# Patient Record
Sex: Female | Born: 1970 | Race: Black or African American | Hispanic: No | Marital: Single | Smoking: Never smoker
Health system: Southern US, Community
[De-identification: ages and names within clinical notes are randomized; demographics above are authoritative.]

## PROBLEM LIST (undated history)

## (undated) DIAGNOSIS — M5430 Sciatica, unspecified side: Secondary | ICD-10-CM

## (undated) DIAGNOSIS — J45909 Unspecified asthma, uncomplicated: Secondary | ICD-10-CM

## (undated) DIAGNOSIS — O009 Unspecified ectopic pregnancy without intrauterine pregnancy: Secondary | ICD-10-CM

## (undated) DIAGNOSIS — IMO0002 Reserved for concepts with insufficient information to code with codable children: Secondary | ICD-10-CM

## (undated) HISTORY — PX: RIGHT OOPHORECTOMY: SHX2359

## (undated) HISTORY — PX: ABDOMINAL SURGERY: SHX537

---

## 2007-05-30 ENCOUNTER — Emergency Department (HOSPITAL_COMMUNITY): Admission: EM | Admit: 2007-05-30 | Discharge: 2007-05-30 | Payer: Self-pay | Admitting: Emergency Medicine

## 2008-11-08 ENCOUNTER — Emergency Department (HOSPITAL_COMMUNITY): Admission: EM | Admit: 2008-11-08 | Discharge: 2008-11-08 | Payer: Self-pay | Admitting: Internal Medicine

## 2009-04-26 ENCOUNTER — Emergency Department (HOSPITAL_COMMUNITY): Admission: EM | Admit: 2009-04-26 | Discharge: 2009-04-27 | Payer: Self-pay | Admitting: Emergency Medicine

## 2009-11-12 ENCOUNTER — Emergency Department (HOSPITAL_COMMUNITY): Admission: EM | Admit: 2009-11-12 | Discharge: 2009-11-12 | Payer: Self-pay | Admitting: Emergency Medicine

## 2010-03-24 ENCOUNTER — Emergency Department (HOSPITAL_COMMUNITY): Admission: EM | Admit: 2010-03-24 | Discharge: 2010-03-24 | Payer: Self-pay | Admitting: Emergency Medicine

## 2010-09-16 ENCOUNTER — Emergency Department (HOSPITAL_COMMUNITY)
Admission: EM | Admit: 2010-09-16 | Discharge: 2010-09-17 | Disposition: A | Discharge status: Home / Self Care | Payer: Self-pay | Attending: Emergency Medicine | Admitting: Emergency Medicine

## 2010-09-16 DIAGNOSIS — M5380 Other specified dorsopathies, site unspecified: Secondary | ICD-10-CM | POA: Insufficient documentation

## 2010-09-16 DIAGNOSIS — G8929 Other chronic pain: Secondary | ICD-10-CM | POA: Insufficient documentation

## 2010-09-16 DIAGNOSIS — IMO0001 Reserved for inherently not codable concepts without codable children: Secondary | ICD-10-CM | POA: Insufficient documentation

## 2010-09-16 DIAGNOSIS — M546 Pain in thoracic spine: Secondary | ICD-10-CM | POA: Insufficient documentation

## 2010-09-17 ENCOUNTER — Emergency Department (HOSPITAL_COMMUNITY)
Admission: EM | Admit: 2010-09-17 | Discharge: 2010-09-17 | Disposition: A | Discharge status: Home / Self Care | Payer: Self-pay | Attending: Emergency Medicine | Admitting: Emergency Medicine

## 2010-09-17 DIAGNOSIS — M546 Pain in thoracic spine: Secondary | ICD-10-CM | POA: Insufficient documentation

## 2010-09-17 DIAGNOSIS — M5380 Other specified dorsopathies, site unspecified: Secondary | ICD-10-CM | POA: Insufficient documentation

## 2010-09-17 DIAGNOSIS — G8929 Other chronic pain: Secondary | ICD-10-CM | POA: Insufficient documentation

## 2010-09-17 DIAGNOSIS — IMO0001 Reserved for inherently not codable concepts without codable children: Secondary | ICD-10-CM | POA: Insufficient documentation

## 2011-01-27 ENCOUNTER — Ambulatory Visit (INDEPENDENT_AMBULATORY_CARE_PROVIDER_SITE_OTHER): Payer: No Typology Code available for payment source

## 2011-01-27 ENCOUNTER — Emergency Department (HOSPITAL_COMMUNITY): Payer: No Typology Code available for payment source

## 2011-01-27 ENCOUNTER — Emergency Department (HOSPITAL_COMMUNITY)
Admission: EM | Admit: 2011-01-27 | Discharge: 2011-01-27 | Disposition: A | Discharge status: Home / Self Care | Payer: No Typology Code available for payment source | Attending: Emergency Medicine | Admitting: Emergency Medicine

## 2011-01-27 ENCOUNTER — Inpatient Hospital Stay (INDEPENDENT_AMBULATORY_CARE_PROVIDER_SITE_OTHER)
Admission: RE | Admit: 2011-01-27 | Discharge: 2011-01-27 | Disposition: A | Discharge status: Home / Self Care | Payer: No Typology Code available for payment source | Source: Ambulatory Visit | Attending: Family Medicine | Admitting: Family Medicine

## 2011-01-27 DIAGNOSIS — K8020 Calculus of gallbladder without cholecystitis without obstruction: Secondary | ICD-10-CM

## 2011-01-27 DIAGNOSIS — G8929 Other chronic pain: Secondary | ICD-10-CM | POA: Insufficient documentation

## 2011-01-27 DIAGNOSIS — M549 Dorsalgia, unspecified: Secondary | ICD-10-CM | POA: Insufficient documentation

## 2011-01-27 DIAGNOSIS — R110 Nausea: Secondary | ICD-10-CM | POA: Insufficient documentation

## 2011-01-27 DIAGNOSIS — R1011 Right upper quadrant pain: Secondary | ICD-10-CM | POA: Insufficient documentation

## 2011-01-27 LAB — POCT URINALYSIS DIP (DEVICE)
Hgb urine dipstick: NEGATIVE
Ketones, ur: NEGATIVE mg/dL
Specific Gravity, Urine: 1.02 (ref 1.005–1.030)
pH: 6.5 (ref 5.0–8.0)

## 2011-01-27 LAB — COMPREHENSIVE METABOLIC PANEL
Albumin: 3.5 g/dL (ref 3.5–5.2)
Alkaline Phosphatase: 65 U/L (ref 39–117)
BUN: 8 mg/dL (ref 6–23)
Calcium: 9.3 mg/dL (ref 8.4–10.5)
Chloride: 102 mEq/L (ref 96–112)
Creatinine, Ser: 0.86 mg/dL (ref 0.50–1.10)
GFR calc non Af Amer: >60 mL/min (ref >60)
Potassium: 3.9 mEq/L (ref 3.5–5.1)
Total Bilirubin: 0.6 mg/dL (ref 0.3–1.2)

## 2011-01-27 LAB — DIFFERENTIAL
Basophils Absolute: 0 10*3/uL (ref 0.0–0.1)
Eosinophils Absolute: 0.1 10*3/uL (ref 0.0–0.7)
Eosinophils Relative: 1 % (ref 0–5)
Lymphocytes Relative: 30 % (ref 12–46)
Monocytes Absolute: 0.3 10*3/uL (ref 0.1–1.0)
Monocytes Relative: 5 % (ref 3–12)
Neutro Abs: 4.2 10*3/uL (ref 1.7–7.7)
Neutrophils Relative %: 63 % (ref 43–77)

## 2011-01-27 LAB — POCT PREGNANCY, URINE: Preg Test, Ur: NEGATIVE

## 2011-01-27 LAB — CBC: MCHC: 34.5 g/dL (ref 30.0–36.0)

## 2011-02-15 ENCOUNTER — Other Ambulatory Visit: Payer: Self-pay | Admitting: Obstetrics

## 2011-02-15 DIAGNOSIS — Z1231 Encounter for screening mammogram for malignant neoplasm of breast: Secondary | ICD-10-CM

## 2011-02-18 ENCOUNTER — Ambulatory Visit (HOSPITAL_COMMUNITY): Payer: No Typology Code available for payment source | Attending: Obstetrics

## 2011-06-24 ENCOUNTER — Emergency Department (HOSPITAL_COMMUNITY)
Admission: EM | Admit: 2011-06-24 | Discharge: 2011-06-24 | Disposition: A | Discharge status: Home / Self Care | Payer: Self-pay | Attending: Emergency Medicine | Admitting: Emergency Medicine

## 2011-06-24 ENCOUNTER — Encounter: Payer: Self-pay | Admitting: *Deleted

## 2011-06-24 DIAGNOSIS — R05 Cough: Secondary | ICD-10-CM | POA: Insufficient documentation

## 2011-06-24 DIAGNOSIS — R509 Fever, unspecified: Secondary | ICD-10-CM | POA: Insufficient documentation

## 2011-06-24 DIAGNOSIS — R059 Cough, unspecified: Secondary | ICD-10-CM | POA: Insufficient documentation

## 2011-06-24 DIAGNOSIS — R110 Nausea: Secondary | ICD-10-CM | POA: Insufficient documentation

## 2011-06-24 DIAGNOSIS — IMO0001 Reserved for inherently not codable concepts without codable children: Secondary | ICD-10-CM | POA: Insufficient documentation

## 2011-06-24 DIAGNOSIS — J111 Influenza due to unidentified influenza virus with other respiratory manifestations: Secondary | ICD-10-CM | POA: Insufficient documentation

## 2011-06-24 DIAGNOSIS — J3489 Other specified disorders of nose and nasal sinuses: Secondary | ICD-10-CM | POA: Insufficient documentation

## 2011-06-24 MED ORDER — SODIUM CHLORIDE 0.9 % IV BOLUS (SEPSIS)
1000.0000 mL | Freq: Once | INTRAVENOUS | Status: AC
  Administered 2011-06-24: 1000 mL via INTRAVENOUS

## 2011-06-24 MED ORDER — ACETAMINOPHEN 325 MG PO TABS
650.0000 mg | ORAL_TABLET | Freq: Once | ORAL | Status: AC
  Administered 2011-06-24: 17:00:00 via ORAL

## 2011-06-24 MED ORDER — IBUPROFEN 600 MG PO TABS: 600.0000 mg | ORAL_TABLET | Freq: Four times a day (QID) | ORAL | Status: AC | PRN

## 2011-06-24 MED ORDER — HYDROCODONE-ACETAMINOPHEN 5-500 MG PO TABS: 1.0000 | ORAL_TABLET | Freq: Four times a day (QID) | ORAL | Status: AC | PRN

## 2011-06-24 MED ORDER — KETOROLAC TROMETHAMINE 30 MG/ML IJ SOLN
30.0000 mg | Freq: Once | INTRAMUSCULAR | Status: AC
  Administered 2011-06-24: 30 mg via INTRAVENOUS
  Filled 2011-06-24: qty 1

## 2011-06-24 MED ORDER — ACETAMINOPHEN 325 MG PO TABS
ORAL_TABLET | ORAL | Status: AC
  Filled 2011-06-24: qty 2

## 2011-06-24 MED ORDER — PSEUDOEPHEDRINE HCL 60 MG PO TABS: 60.0000 mg | ORAL_TABLET | ORAL | Status: AC | PRN

## 2011-06-24 NOTE — ED Notes (Signed)
rx x 3, pt voiced understanding to f/u with PCP. 

## 2011-06-24 NOTE — ED Notes (Signed)
Pt has been having flu like symptoms with body aches, fever and URI for 2 days.

## 2011-06-24 NOTE — ED Provider Notes (Signed)
History     CSN: [id number] Arrival date & time: 06/24/2011  4:52 PM   First MD Initiated Contact with Patient 06/24/11 2038      Chief Complaint  Patient presents with  . Influenza    (Consider location/radiation/quality/duration/timing/severity/associated sxs/prior treatment) The history is provided by the patient.   Patient is a 40 year old female with no significant medical problems who presents with fever, rhinorrhea, nasal congestion and myalgias.  She states this started 2-3 days ago. It has been constant and progressively worsening. She notes mild intermittent dry cough but no productive sputum and no respiratory distress. She is tolerating by mouth well and has no abdominal pain. She has only had mild nausea which resolved yesterday. She did not get flu shot this year. She has no known sick contacts. Highest fever was 101. Overall severity described as moderate. Nothing has been noted to make things better. No history of pneumonia or serious illness in the past.  History reviewed. No pertinent past medical history.  History reviewed. No pertinent past surgical history.  No family history on file.  History  Substance Use Topics  . Smoking status: Not on file  . Smokeless tobacco: Not on file  . Alcohol Use: No    OB History    Grav Para Term Preterm Abortions TAB SAB Ect Mult Living                  Review of Systems  Constitutional: Negative for fever and chills.  HENT: Positive for congestion and rhinorrhea. Negative for facial swelling.   Eyes: Negative for visual disturbance.  Respiratory: Negative for cough, chest tightness, shortness of breath and wheezing.   Cardiovascular: Negative for chest pain.  Gastrointestinal: Negative for nausea, vomiting, abdominal pain and diarrhea.  Genitourinary: Negative for difficulty urinating.  Skin: Negative for rash.  Neurological: Negative for weakness and numbness.  Psychiatric/Behavioral: Negative for behavioral  problems and confusion.  All other systems reviewed and are negative.    Allergies  Review of patient's allergies indicates no known allergies.  Home Medications   Current Outpatient Rx  Name Route Sig Dispense Refill  . HYDROCODONE-ACETAMINOPHEN 5-500 MG PO TABS Oral Take 1 tablet by mouth every 6 (six) hours as needed for pain. 15 tablet 0  . IBUPROFEN 600 MG PO TABS Oral Take 1 tablet (600 mg total) by mouth every 6 (six) hours as needed for pain. 30 tablet 0  . PSEUDOEPHEDRINE HCL 60 MG PO TABS Oral Take 1 tablet (60 mg total) by mouth every 4 (four) hours as needed for congestion. 30 tablet 0    BP 110/71  Pulse 81  Temp(Src) 98 F (36.7 C) (Oral)  Resp 18  SpO2 100%  Physical Exam  Nursing note and vitals reviewed. Constitutional: She is oriented to person, place, and time. She appears well-developed and well-nourished. No distress.       Appears mildly dehydrated  HENT:  Head: Normocephalic.  Nose: Nose normal.  Eyes: EOM are normal.       Mild clear nasal drainage and congestion  Neck: Normal range of motion. Neck supple.  Cardiovascular: Normal rate, regular rhythm and intact distal pulses.   No murmur heard. Pulmonary/Chest: Effort normal and breath sounds normal. No respiratory distress. She has no wheezes. She has no rales. She exhibits tenderness (mild chest wall ttp).  Abdominal: Soft. She exhibits no distension. There is no tenderness.  Musculoskeletal: Normal range of motion. She exhibits no edema and no tenderness.  No calf TTP  Neurological: She is alert and oriented to person, place, and time.       Normal strength  Skin: Skin is warm and dry. No rash noted. She is not diaphoretic.  Psychiatric: She has a normal mood and affect. Her behavior is normal. Thought content normal.    ED Course  Procedures (including critical care time)  Labs Reviewed - No data to display No results found.   1. Influenza       MDM   Clinical picture is  consistent with influenza. Patient is not pregnant and does not have risk factors that require admission or Tamiflu. She is outside the 48 hour window for normal patients. Patient feels much better after interventions in the emergency department. She was reevaluated and still has good air movement without any respiratory issues. No acute indication for chest x-ray. No clinical evidence of pneumonia or other serious infection. Return precautions discussed.        Milus Glazier 06/25/11 0034

## 2011-06-24 NOTE — ED Notes (Signed)
Body aches; fever; nasal congestion; mild cough. Pt reports 101.1 as highest fever; pt has taken tylenol in triage. Pt reports OTC medicine in attempt to treat symptoms. Mask on pt.

## 2011-06-26 NOTE — ED Provider Notes (Signed)
I saw and evaluated the patient, reviewed the resident's note and I agree with the findings and plan. Female with likely Flu. Improved with treatment  Alisha Torres. Rubin Payor, MD 06/26/11 [phone]

## 2012-08-13 ENCOUNTER — Encounter (HOSPITAL_COMMUNITY): Payer: Self-pay | Admitting: Nurse Practitioner

## 2012-08-13 ENCOUNTER — Emergency Department (HOSPITAL_COMMUNITY)
Admission: EM | Admit: 2012-08-13 | Discharge: 2012-08-13 | Disposition: A | Discharge status: Home / Self Care | Payer: Self-pay | Attending: Emergency Medicine | Admitting: Emergency Medicine

## 2012-08-13 ENCOUNTER — Emergency Department (HOSPITAL_COMMUNITY): Payer: Self-pay

## 2012-08-13 DIAGNOSIS — Y939 Activity, unspecified: Secondary | ICD-10-CM | POA: Insufficient documentation

## 2012-08-13 DIAGNOSIS — S90129A Contusion of unspecified lesser toe(s) without damage to nail, initial encounter: Secondary | ICD-10-CM | POA: Insufficient documentation

## 2012-08-13 DIAGNOSIS — S90112A Contusion of left great toe without damage to nail, initial encounter: Secondary | ICD-10-CM

## 2012-08-13 DIAGNOSIS — IMO0002 Reserved for concepts with insufficient information to code with codable children: Secondary | ICD-10-CM | POA: Insufficient documentation

## 2012-08-13 DIAGNOSIS — Y9289 Other specified places as the place of occurrence of the external cause: Secondary | ICD-10-CM | POA: Insufficient documentation

## 2012-08-13 DIAGNOSIS — S8390XA Sprain of unspecified site of unspecified knee, initial encounter: Secondary | ICD-10-CM

## 2012-08-13 NOTE — ED Notes (Addendum)
Pt states she dropped a generator on her foot 2 nights ago and having pain radiating from foot all the way up to knee since. Also reports bruising to top of foot. Applied ice,tylenol, and elevation with no relief

## 2012-08-13 NOTE — ED Notes (Signed)
Back from xray, talking on phone, NAD, calm.

## 2012-08-13 NOTE — ED Provider Notes (Signed)
History     CSN: [id number]  Arrival date & time 08/13/12  1751   First MD Initiated Contact with Patient 08/13/12 1812      Chief Complaint  Patient presents with  . Foot Injury    (Consider location/radiation/quality/duration/timing/severity/associated sxs/prior treatment) Patient is a 42 y.o. female presenting with foot injury. The history is provided by the patient.  Foot Injury  Pertinent negatives include no numbness.   patient dropped a generator on her foot 2 nights ago. She's had some pain in her left great toe since then. Stepping out of bed last night she felt a pop and pain in her left knee. He said she's had some pain since. The pain is more towards the bottom of her knee. No numbness or weakness. No other trauma. No swelling or legs. No chest pain. No abdominal pain.  History reviewed. No pertinent past medical history.  History reviewed. No pertinent past surgical history.  History reviewed. No pertinent family history.  History  Substance Use Topics  . Smoking status: Never Smoker   . Smokeless tobacco: Not on file  . Alcohol Use: No    OB History    Grav Para Term Preterm Abortions TAB SAB Ect Mult Living                  Review of Systems  Constitutional: Negative for fever and fatigue.  Respiratory: Negative for cough and shortness of breath.   Cardiovascular: Negative for chest pain and leg swelling.  Musculoskeletal: Positive for joint swelling and gait problem. Negative for myalgias and back pain.  Skin: Negative for color change and wound.  Neurological: Negative for numbness.  Hematological: Negative for adenopathy.    Allergies  Review of patient's allergies indicates no known allergies.  Home Medications   Current Outpatient Rx  Name  Route  Sig  Dispense  Refill  . ACETAMINOPHEN 500 MG PO TABS   Oral   Take 1,000 mg by mouth every 6 (six) hours as needed. For pain           BP 131/88  Pulse 68  Temp 98 F (36.7 C) (Oral)   Resp 15  SpO2 100%  Physical Exam  Constitutional: She appears well-developed and well-nourished.  HENT:  Head: Normocephalic.  Cardiovascular: Normal rate.   Pulmonary/Chest: No respiratory distress.  Musculoskeletal:       Subungual bruising on left great toe. Tender to distal phalanx of left great toe. No crepitance or deformity. Skin intact. Range of motion intact it to. No swelling of foot. No swelling of lower leg. Mild tenderness over left anterior knee. Knee effusion. Skin is normal. Strong dorsalis pedis pulse.  Neurological:       Sensation intact over left lower extremity    ED Course  Procedures (including critical care time)  Labs Reviewed - No data to display Dg Knee Complete 4 Views Left  08/13/2012  *RADIOLOGY REPORT*  Clinical Data: Injured left knee.  LEFT KNEE - COMPLETE 4+ VIEW  Comparison: None  Findings: The joint spaces are maintained.  No acute fracture or osteochondral abnormality.  A moderate sized joint effusion is noted.  IMPRESSION: No acute bony findings.  Moderate sized joint effusion.   Original Report Authenticated By: Rudie Meyer, M.D.    Dg Toe Great Left  08/13/2012  *RADIOLOGY REPORT*  Clinical Data: Injured left great toe.  LEFT GREAT TOE  Comparison: None  Findings: The joint spaces are maintained.  No acute fracture.  IMPRESSION:  No acute bony findings.   Original Report Authenticated By: Rudie Meyer, M.D.      1. Knee sprain   2. Contusion of great toe of left foot       MDM  Patient with toe pain and knee pain. X-ray of toe is negative. Knee shows some effusion. Does not appear severe pain for an knee immobilizer. Patient was given an Ace wrap and will followup as needed.        Juliet Rude. Rubin Payor, MD 08/13/12 2358

## 2012-08-13 NOTE — ED Notes (Signed)
CMS intact, pulses palpable, ROM intact, guarding movement, ambulatory with limp, rates pain high, 8/10, feels better after ACE wrap, declined pain med, out to d/c desk in w/c, family coming to give pt ride home.

## 2012-08-13 NOTE — ED Notes (Signed)
Received report from KP, RN, pt out of room to xray via stretcher, alert, NAD, calm, interactive, watching TV, chewing gum.

## 2012-11-08 ENCOUNTER — Emergency Department (HOSPITAL_COMMUNITY)
Admission: EM | Admit: 2012-11-08 | Discharge: 2012-11-08 | Disposition: A | Discharge status: Home / Self Care | Payer: Self-pay | Attending: Emergency Medicine | Admitting: Emergency Medicine

## 2012-11-08 ENCOUNTER — Emergency Department (HOSPITAL_COMMUNITY): Payer: Self-pay

## 2012-11-08 ENCOUNTER — Encounter (HOSPITAL_COMMUNITY): Payer: Self-pay | Admitting: Emergency Medicine

## 2012-11-08 DIAGNOSIS — R209 Unspecified disturbances of skin sensation: Secondary | ICD-10-CM | POA: Insufficient documentation

## 2012-11-08 DIAGNOSIS — M5442 Lumbago with sciatica, left side: Secondary | ICD-10-CM

## 2012-11-08 DIAGNOSIS — Z3202 Encounter for pregnancy test, result negative: Secondary | ICD-10-CM | POA: Insufficient documentation

## 2012-11-08 DIAGNOSIS — M5430 Sciatica, unspecified side: Secondary | ICD-10-CM | POA: Insufficient documentation

## 2012-11-08 LAB — POCT PREGNANCY, URINE: Preg Test, Ur: NEGATIVE

## 2012-11-08 MED ORDER — DIAZEPAM 5 MG/ML IJ SOLN
2.5000 mg | Freq: Once | INTRAMUSCULAR | Status: AC
  Administered 2012-11-08: 2.5 mg via INTRAVENOUS
  Filled 2012-11-08: qty 2

## 2012-11-08 MED ORDER — MORPHINE SULFATE 4 MG/ML IJ SOLN
6.0000 mg | Freq: Once | INTRAMUSCULAR | Status: AC
  Administered 2012-11-08: 6 mg via INTRAVENOUS
  Filled 2012-11-08: qty 2

## 2012-11-08 MED ORDER — METHYLPREDNISOLONE SODIUM SUCC 125 MG IJ SOLR
125.0000 mg | Freq: Once | INTRAMUSCULAR | Status: AC
  Administered 2012-11-08: 125 mg via INTRAVENOUS
  Filled 2012-11-08: qty 2

## 2012-11-08 MED ORDER — IBUPROFEN 800 MG PO TABS
800.0000 mg | ORAL_TABLET | Freq: Once | ORAL | Status: AC
  Administered 2012-11-08: 800 mg via ORAL
  Filled 2012-11-08: qty 1

## 2012-11-08 MED ORDER — PREDNISONE 20 MG PO TABS: 40.0000 mg | ORAL_TABLET | Freq: Every day | ORAL | Status: DC

## 2012-11-08 MED ORDER — CYCLOBENZAPRINE HCL 10 MG PO TABS: 10.0000 mg | ORAL_TABLET | Freq: Two times a day (BID) | ORAL | Status: DC | PRN

## 2012-11-08 MED ORDER — OXYCODONE-ACETAMINOPHEN 5-325 MG PO TABS
1.0000 | ORAL_TABLET | Freq: Once | ORAL | Status: AC
  Administered 2012-11-08: 1 via ORAL
  Filled 2012-11-08: qty 1

## 2012-11-08 MED ORDER — DIPHENHYDRAMINE HCL 50 MG/ML IJ SOLN
INTRAMUSCULAR | Status: AC
  Administered 2012-11-08: 25 mg
  Filled 2012-11-08: qty 1

## 2012-11-08 MED ORDER — OXYCODONE-ACETAMINOPHEN 5-325 MG PO TABS: 1.0000 | ORAL_TABLET | Freq: Four times a day (QID) | ORAL | Status: DC | PRN

## 2012-11-08 NOTE — ED Notes (Signed)
Resting states cannot void now

## 2012-11-08 NOTE — ED Notes (Signed)
States she cannot walk dr powers aware he will be in

## 2012-11-08 NOTE — ED Notes (Signed)
Pt called [facility] has hives on arm where iV is dr powers made aware and pt given 25 mg benadryl

## 2012-11-08 NOTE — ED Notes (Signed)
Pt sleeping pain  Pain is better

## 2012-11-08 NOTE — ED Provider Notes (Signed)
History     CSN: [id number]  Arrival date & time 11/08/12  1053   First MD Initiated Contact with Patient 11/08/12 1055      Chief Complaint  Patient presents with  . Back Pain    (Consider location/radiation/quality/duration/timing/severity/associated sxs/prior treatment) HPI Comments: Alisha Torres reports lower back pain began yesterday as a dull ache and has steadily increased in intensity since.  She can recount no trauma or event that may have precipitated this event.  Patient is a 42 y.o. female presenting with back pain. The history is provided by the patient. No language interpreter was used.  Back Pain Location:  Lumbar spine and gluteal region Quality:  Cramping and shooting Radiates to:  L posterior upper leg and L foot Pain severity:  Severe Pain is:  Same all the time Onset quality:  Gradual Duration:  1 day Timing:  Constant Progression:  Worsening Chronicity:  New (pt had a similar episode several years ago) Context: not falling, not jumping from heights, not lifting heavy objects, not MCA, not MVA, not occupational injury, not pedestrian accident, not physical stress, not recent illness and not recent injury   Relieved by:  Being still Worsened by:  Bending, movement and standing Associated symptoms: numbness (lateral 2 toes on left foot) and paresthesias   Associated symptoms: no abdominal pain, no abdominal swelling, no bladder incontinence, no bowel incontinence, no chest pain, no dysuria, no fever, no headaches, no pelvic pain, no perianal numbness, no tingling and no weakness   Risk factors: no hx of cancer, no lack of exercise, no menopause, not pregnant and no steroid use     History reviewed. No pertinent past medical history.  No past surgical history on file.  No family history on file.  History  Substance Use Topics  . Smoking status: Never Smoker   . Smokeless tobacco: Not on file  . Alcohol Use: No    OB History   Grav Para Term Preterm  Abortions TAB SAB Ect Mult Living                  Review of Systems  Constitutional: Negative for fever.  Cardiovascular: Negative for chest pain.  Gastrointestinal: Negative for abdominal pain and bowel incontinence.  Genitourinary: Negative for bladder incontinence, dysuria and pelvic pain.  Musculoskeletal: Positive for back pain. Negative for myalgias, joint swelling, arthralgias and gait problem.  Skin: Negative for color change, pallor, rash and wound.  Neurological: Positive for numbness (lateral 2 toes on left foot) and paresthesias. Negative for tingling, tremors, syncope, weakness, light-headedness and headaches.  All other systems reviewed and are negative.    Allergies  Review of patient's allergies indicates no known allergies.  Home Medications  No current outpatient prescriptions on file.  BP 126/72  Pulse 71  Resp 16  Physical Exam  Nursing note and vitals reviewed. Constitutional: She is oriented to person, place, and time. She appears well-developed and well-nourished. No distress.  HENT:  Head: Normocephalic and atraumatic.  Right Ear: External ear normal.  Left Ear: External ear normal.  Nose: Nose normal.  Mouth/Throat: Oropharynx is clear and moist. No oropharyngeal exudate.  Eyes: Conjunctivae are normal. Pupils are equal, round, and reactive to light. Right eye exhibits no discharge. Left eye exhibits no discharge. No scleral icterus.  Neck: Normal range of motion. Neck supple. No JVD present. No tracheal deviation present.  Cardiovascular: Normal rate, regular rhythm, normal heart sounds and intact distal pulses.  Exam reveals no gallop and  no friction rub.   No murmur heard. Pulmonary/Chest: Effort normal and breath sounds normal. No stridor. No respiratory distress. She has no wheezes. She has no rales. She exhibits no tenderness.  Abdominal: Soft. Bowel sounds are normal. She exhibits no distension and no mass. There is no tenderness. There is no  rebound and no guarding.  Musculoskeletal: She exhibits tenderness. She exhibits no edema.       Right hip: Normal.       Left hip: Normal.       Right knee: Normal.       Left knee: Normal.       Right ankle: Normal.       Left ankle: Normal.       Cervical back: Normal.       Thoracic back: Normal.       Lumbar back: She exhibits decreased range of motion, tenderness, pain and spasm. She exhibits no bony tenderness, no swelling, no edema, no deformity and no laceration.       Back:       Right upper leg: Normal.       Left upper leg: Normal.       Right lower leg: Normal.       Left lower leg: Normal.       Right foot: Normal.       Left foot: Normal.  Lymphadenopathy:    She has no cervical adenopathy.  Neurological: She is alert and oriented to person, place, and time. She is not disoriented. No cranial nerve deficit. She exhibits normal muscle tone. Coordination normal. She displays no Babinski's sign on the right side. She displays no Babinski's sign on the left side.  Reflex Scores:      Patellar reflexes are 2+ on the right side and 2+ on the left side. + antalgic gait. Pos straight leg raise on left with pain at < 30 degrees.  Intact and symmetric lower extremity strength and sensation to fine touch.  Skin: Skin is warm and dry. No rash noted. She is not diaphoretic. No erythema. No pallor.  Psychiatric: She has a normal mood and affect. Her behavior is normal.    ED Course  Procedures (including critical care time)  Labs Reviewed - No data to display No results found.   No diagnosis found.    MDM  Pt presents for evaluation of acute back pain radiating down the left leg.  She appears acutely uncomfortable, note stable VS, NAD.  Pt unable to complete exam secondary to discomfort. Will administer solumedrol, morphine, and valium.  Will reassess once pain is better controlled.  1400.  Pt stable, NAD.  Pain is greatly improved.  Note no malalignment or fracture on  x-ray.  There is loss of disc height at L4-5.  Plan discharge home.  Encouraged close outpatient follow-up.       Tobin Chad, MD 11/08/12 1407

## 2012-11-08 NOTE — ED Notes (Signed)
Hx of sciatic a few years ago she had shots and it helped pain strated last night tried to get comfortable ended up on floor and then could not get up this am

## 2013-02-14 ENCOUNTER — Ambulatory Visit
Admission: RE | Admit: 2013-02-14 | Discharge: 2013-02-14 | Disposition: A | Discharge status: Home / Self Care | Payer: No Typology Code available for payment source | Source: Ambulatory Visit | Attending: General Practice | Admitting: General Practice

## 2013-02-14 ENCOUNTER — Other Ambulatory Visit: Payer: Self-pay | Admitting: General Practice

## 2013-02-14 DIAGNOSIS — Z9289 Personal history of other medical treatment: Secondary | ICD-10-CM

## 2013-03-03 ENCOUNTER — Emergency Department (HOSPITAL_COMMUNITY): Payer: Medicaid Other

## 2013-03-03 ENCOUNTER — Emergency Department (HOSPITAL_COMMUNITY)
Admission: EM | Admit: 2013-03-03 | Discharge: 2013-03-04 | Disposition: A | Discharge status: Home / Self Care | Payer: Medicaid Other | Attending: Emergency Medicine | Admitting: Emergency Medicine

## 2013-03-03 ENCOUNTER — Encounter (HOSPITAL_COMMUNITY): Payer: Self-pay | Admitting: Emergency Medicine

## 2013-03-03 DIAGNOSIS — R0789 Other chest pain: Secondary | ICD-10-CM | POA: Insufficient documentation

## 2013-03-03 DIAGNOSIS — J45909 Unspecified asthma, uncomplicated: Secondary | ICD-10-CM | POA: Insufficient documentation

## 2013-03-03 DIAGNOSIS — L538 Other specified erythematous conditions: Secondary | ICD-10-CM | POA: Insufficient documentation

## 2013-03-03 DIAGNOSIS — R0989 Other specified symptoms and signs involving the circulatory and respiratory systems: Secondary | ICD-10-CM | POA: Insufficient documentation

## 2013-03-03 DIAGNOSIS — J40 Bronchitis, not specified as acute or chronic: Secondary | ICD-10-CM

## 2013-03-03 LAB — BASIC METABOLIC PANEL
BUN: 8 mg/dL (ref 6–23)
CO2: 25 mEq/L (ref 19–32)
Chloride: 103 mEq/L (ref 96–112)
Glucose, Bld: 84 mg/dL (ref 70–99)
Potassium: 3.8 mEq/L (ref 3.5–5.1)
Sodium: 135 mEq/L (ref 135–145)

## 2013-03-03 LAB — CBC
HCT: 35 % — ABNORMAL LOW (ref 36.0–46.0)
Hemoglobin: 11.8 g/dL — ABNORMAL LOW (ref 12.0–15.0)
MCHC: 33.7 g/dL (ref 30.0–36.0)
RBC: 4.07 MIL/uL (ref 3.87–5.11)

## 2013-03-03 MED ORDER — AZITHROMYCIN 250 MG PO TABS: 250.0000 mg | ORAL_TABLET | Freq: Every day | ORAL | Status: DC

## 2013-03-03 MED ORDER — ALBUTEROL SULFATE HFA 108 (90 BASE) MCG/ACT IN AERS
2.0000 | INHALATION_SPRAY | RESPIRATORY_TRACT | Status: DC | PRN
  Administered 2013-03-03: 2 via RESPIRATORY_TRACT
  Filled 2013-03-03: qty 6.7

## 2013-03-03 MED ORDER — GUAIFENESIN-CODEINE 100-10 MG/5ML PO SOLN: 10.0000 mL | Freq: Three times a day (TID) | ORAL | Status: DC | PRN

## 2013-03-03 MED ORDER — ALBUTEROL SULFATE (5 MG/ML) 0.5% IN NEBU
5.0000 mg | INHALATION_SOLUTION | Freq: Once | RESPIRATORY_TRACT | Status: AC
  Administered 2013-03-03: 5 mg via RESPIRATORY_TRACT
  Filled 2013-03-03: qty 1

## 2013-03-03 MED ORDER — GUAIFENESIN-CODEINE 100-10 MG/5ML PO SOLN
10.0000 mL | Freq: Once | ORAL | Status: AC
  Administered 2013-03-03: 10 mL via ORAL
  Filled 2013-03-03: qty 10

## 2013-03-03 NOTE — ED Notes (Signed)
Pt c/o SOB, with productive cough.

## 2013-03-03 NOTE — ED Provider Notes (Signed)
CSN: [id number]     Arrival date & time 03/03/13  2145 History  This chart was scribed for non-physician practitioner, Marlon Pel, PA-C working with Doug Sou, MD by Greggory Stallion, ED scribe. This patient was seen in room TR09C/TR09C and the patient's care was started at 10:12 PM.   Chief Complaint  Patient presents with  . Cough   The history is provided by the patient. No language interpreter was used.    HPI Comments: Alisha Torres is a 42 y.o. female who presents to the Emergency Department complaining of gradual onset, gradually worsening productive cough of yellow sputum with associated chest tightness that started 4 days ago. She states it feels like something is stuck in her throat. She has tried Benadryl and Mucinex with no relief. She states sitting up sometimes helps the cough and laying flat worsens the cough. 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 She states she has had numbness in her left toes for one month. Pt denies fever, trouble swallowing, CP, palpitations, nausea, emesis, diarrhea, dysuria, headache and visual disturbance as associated symptoms. Pt has been eating and drinking normally. She states she had asthma when she was younger but hasn't had it since she was 39.   No past medical history on file. No past surgical history on file. No family history on file. History  Substance Use Topics  . Smoking status: Never Smoker   . Smokeless tobacco: Not on file  . Alcohol Use: No   OB History   Grav Para Term Preterm Abortions TAB SAB Ect Mult Living                 Review of Systems  Constitutional: Negative for fever and chills.  HENT: Negative for trouble swallowing.   Eyes: Negative for visual disturbance.  Respiratory: Positive for cough and chest tightness.   Cardiovascular: Negative for chest pain and palpitations.  Gastrointestinal: Negative for nausea, vomiting and diarrhea.  Genitourinary:  Negative for dysuria.  Neurological: Negative for headaches.  All other systems reviewed and are negative.    Allergies  Review of patient's allergies indicates no known allergies.  Home Medications   Current Outpatient Rx  Name  Route  Sig  Dispense  Refill  . acetaminophen (TYLENOL) 500 MG tablet   Oral   Take 500-1,000 mg by mouth 2 (two) times daily as needed for pain.         . cetirizine (ZYRTEC) 10 MG tablet   Oral   Take 10 mg by mouth daily as needed for allergies.         Marland Kitchen guaiFENesin (MUCINEX) 600 MG 12 hr tablet   Oral   Take 1,200 mg by mouth 2 (two) times daily as needed (for cough).         Marland Kitchen guaiFENesin-codeine 100-10 MG/5ML syrup   Oral   Take 10 mLs by mouth 3 (three) times daily as needed for cough.   120 mL   0    BP 136/86  Pulse 98  Resp 20  SpO2 97%  LMP 02/05/2013  Physical Exam  Nursing note and vitals reviewed. Constitutional: She is oriented to person, place, and time. She appears well-developed and well-nourished. No distress.  HENT:  Head: Normocephalic and atraumatic.  Right Ear: Tympanic membrane is erythematous.  Left Ear: Tympanic membrane is erythematous.  Red posterior oropharynx. No exudates.   Eyes: EOM are normal. Pupils are equal,  round, and reactive to light.  Neck: Normal range of motion. Neck supple. No tracheal deviation present.  Cardiovascular: Normal rate.   Pulmonary/Chest: Effort normal. No respiratory distress.  Inspiratory rhonchi, left base greater than the right base.   Musculoskeletal: Normal range of motion.  Neurological: She is alert and oriented to person, place, and time.  Skin: Skin is warm and dry. She is not diaphoretic.  Psychiatric: She has a normal mood and affect. Her behavior is normal.    ED Course   Procedures (including critical care time)  DIAGNOSTIC STUDIES: Oxygen Saturation is 97% on RA, normal by my interpretation.    COORDINATION OF CARE:   Labs Reviewed  CBC -  Abnormal; Notable for the following:    Hemoglobin 11.8 (*)    HCT 35.0 (*)    All other components within normal limits  BASIC METABOLIC PANEL   Dg Chest 2 View  03/03/2013   *RADIOLOGY REPORT*  Clinical Data: Cough for 4 days; history of asthma.  CHEST - 2 VIEW  Comparison: Chest radiograph performed 02/14/2013  Findings: The lungs are well-aerated and clear.  There is no evidence of focal opacification, pleural effusion or pneumothorax.  The heart is normal in size; the mediastinal contour is within normal limits.  No acute osseous abnormalities are seen.  IMPRESSION: No acute cardiopulmonary process seen.   Original Report Authenticated By: Tonia Ghent, M.D.   1. Bronchitis     MDM   pts symptoms consistent with URI. Will give Rx for z-pack and guaifenesin with codeine. Given albuterol treatment in ED and HFA inhaler for home.   Normal chest xray, and healthy at baseline.  42 y.o.Anam Creighton's evaluation in the Emergency Department is complete. It has been determined that no acute conditions requiring further emergency intervention are present at this time. The patient/guardian have been advised of the diagnosis and plan. We have discussed signs and symptoms that warrant return to the ED, such as changes or worsening in symptoms.  Vital signs are stable at discharge. Filed Vitals:   03/03/13 2151  BP: 136/86  Pulse: 98  Resp: 20    Patient/guardian has voiced understanding and agreed to follow-up with the PCP or specialist.  I personally performed the services described in this documentation, which was scribed in my presence. The recorded information has been reviewed and is accurate.    Dorthula Matas, PA-C 03/03/13 2354

## 2013-03-03 NOTE — ED Notes (Signed)
Pt st's she has had a cough with shortness of breath x's 4 days.  St's unable to sleep at night due to cough.

## 2013-03-04 NOTE — ED Provider Notes (Signed)
Medical screening examination/treatment/procedure(s) were performed by non-physician practitioner and as supervising physician I was immediately available for consultation/collaboration.  Doug Sou, MD 03/04/13 0272

## 2013-04-03 ENCOUNTER — Encounter (HOSPITAL_COMMUNITY): Payer: Self-pay | Admitting: Emergency Medicine

## 2013-04-03 ENCOUNTER — Emergency Department (HOSPITAL_COMMUNITY)
Admission: EM | Admit: 2013-04-03 | Discharge: 2013-04-03 | Disposition: A | Discharge status: Home / Self Care | Payer: Medicaid Other | Source: Home / Self Care | Attending: Family Medicine | Admitting: Family Medicine

## 2013-04-03 DIAGNOSIS — J302 Other seasonal allergic rhinitis: Secondary | ICD-10-CM

## 2013-04-03 DIAGNOSIS — J309 Allergic rhinitis, unspecified: Secondary | ICD-10-CM

## 2013-04-03 NOTE — ED Provider Notes (Signed)
CSN: [id number]     Arrival date & time 04/03/13  7829 History   First MD Initiated Contact with Patient 04/03/13 [phone]     Chief Complaint  Patient presents with  . Foreign Body in Ear    ?'s cotton from q tip   (Consider location/radiation/quality/duration/timing/severity/associated sxs/prior Treatment) Patient is a 42 y.o. female presenting with foreign body in ear. The history is provided by the patient.  Foreign Body in Ear This is a new problem. The current episode started more than 1 week ago. The problem has not changed since onset.Associated symptoms comments: Sinus cong, pnd, decreased hearing right .Marland Kitchen    History reviewed. No pertinent past medical history. Past Surgical History  Procedure Laterality Date  . Abdominal surgery     History reviewed. No pertinent family history. History  Substance Use Topics  . Smoking status: Never Smoker   . Smokeless tobacco: Not on file  . Alcohol Use: No   OB History   Grav Para Term Preterm Abortions TAB SAB Ect Mult Living                 Review of Systems  Constitutional: Negative.   HENT: Positive for hearing loss, ear pain, congestion, rhinorrhea and postnasal drip.     Allergies  Review of patient's allergies indicates no known allergies.  Home Medications   Current Outpatient Rx  Name  Route  Sig  Dispense  Refill  . acetaminophen (TYLENOL) 500 MG tablet   Oral   Take 500-1,000 mg by mouth 2 (two) times daily as needed for pain.         Marland Kitchen azithromycin (ZITHROMAX) 250 MG tablet   Oral   Take 1 tablet (250 mg total) by mouth daily. Take first 2 tablets together, then 1 every day until finished.   6 tablet   0   . cetirizine (ZYRTEC) 10 MG tablet   Oral   Take 10 mg by mouth daily as needed for allergies.         Marland Kitchen guaiFENesin (MUCINEX) 600 MG 12 hr tablet   Oral   Take 1,200 mg by mouth 2 (two) times daily as needed (for cough).         Marland Kitchen guaiFENesin-codeine 100-10 MG/5ML syrup   Oral   Take 10 mLs  by mouth 3 (three) times daily as needed for cough.   120 mL   0    BP 119/83  Pulse 79  Temp(Src) 99.2 F (37.3 C) (Oral)  Resp 12  SpO2 100%  LMP 03/21/2013 Physical Exam  Nursing note and vitals reviewed. Constitutional: She is oriented to person, place, and time. She appears well-developed and well-nourished.  HENT:  Head: Normocephalic.  Right Ear: External ear normal.  Left Ear: External ear normal.  Mouth/Throat: Oropharynx is clear and moist.  Eyes: Conjunctivae are normal. Pupils are equal, round, and reactive to light.  Neck: Normal range of motion. Neck supple.  Lymphadenopathy:    She has no cervical adenopathy.  Neurological: She is alert and oriented to person, place, and time.  Skin: Skin is warm and dry.    ED Course  Procedures (including critical care time) Labs Review Labs Reviewed - No data to display Imaging Review No results found.  MDM  Ear irrigated, no fb    Linna Hoff, MD 04/03/13 [phone]

## 2013-04-03 NOTE — ED Notes (Signed)
Reports might have q tip cotton stuck in ear. Having pain and muffled sound in ear. States " bad taste in back of mouth"   Pt has flushed ears with no relief in symptoms.

## 2013-05-17 ENCOUNTER — Ambulatory Visit: Payer: Self-pay | Admitting: Obstetrics

## 2013-06-06 ENCOUNTER — Ambulatory Visit (INDEPENDENT_AMBULATORY_CARE_PROVIDER_SITE_OTHER): Payer: Medicaid Other | Admitting: Obstetrics

## 2013-06-06 ENCOUNTER — Encounter: Payer: Self-pay | Admitting: Obstetrics

## 2013-06-06 VITALS — BP 115/76 | HR 83 | Temp 98.3°F | Ht 65.5 in | Wt 211.0 lb

## 2013-06-06 DIAGNOSIS — Z113 Encounter for screening for infections with a predominantly sexual mode of transmission: Secondary | ICD-10-CM

## 2013-06-06 DIAGNOSIS — Z0000 Encounter for general adult medical examination without abnormal findings: Secondary | ICD-10-CM

## 2013-06-06 DIAGNOSIS — Z1239 Encounter for other screening for malignant neoplasm of breast: Secondary | ICD-10-CM

## 2013-06-06 NOTE — Progress Notes (Signed)
.   Subjective:     Alisha Torres is a 42 y.o. female here for a routine exam.  No current complaints.  Personal health questionnaire reviewed: yes.   Gynecologic History Patient's last menstrual period was 05/31/2013. Contraception: none Last Pap: 2 yrs ago. Results were: normal Last mammogram: N/A. Results were: n/a  Obstetric History OB History  No data available     The following portions of the patient's history were reviewed and updated as appropriate: allergies, current medications, past family history, past medical history, past social history, past surgical history and problem list.  Review of Systems Pertinent items are noted in HPI.    Objective:    General appearance: alert and no distress Breasts: normal appearance, no masses or tenderness Abdomen: normal findings: soft, non-tender   Pelvic:  NEFG.  Vagina and cervix normal.  Uterus NSSC, NT  Adnexa negative.  Assessment:    Healthy female exam.    Plan:    Education reviewed: low fat, low cholesterol diet, safe sex/STD prevention and self breast exams. Mammogram ordered. Follow up in: 1 year.

## 2013-06-07 LAB — PAP IG W/ RFLX HPV ASCU

## 2013-06-07 LAB — WET PREP BY MOLECULAR PROBE
Candida species: NEGATIVE
Trichomonas vaginosis: NEGATIVE

## 2013-06-07 LAB — GC/CHLAMYDIA PROBE AMP
CT Probe RNA: NEGATIVE
GC Probe RNA: NEGATIVE

## 2013-06-07 LAB — HEPATITIS B SURFACE ANTIGEN: Hepatitis B Surface Ag: NEGATIVE

## 2013-06-12 ENCOUNTER — Other Ambulatory Visit: Payer: Self-pay | Admitting: *Deleted

## 2013-06-12 DIAGNOSIS — B9689 Other specified bacterial agents as the cause of diseases classified elsewhere: Secondary | ICD-10-CM

## 2013-06-12 MED ORDER — METRONIDAZOLE 500 MG PO TABS: 500.0000 mg | ORAL_TABLET | Freq: Two times a day (BID) | ORAL | Status: DC

## 2013-07-17 ENCOUNTER — Encounter (HOSPITAL_COMMUNITY): Payer: Self-pay | Admitting: Emergency Medicine

## 2013-07-17 ENCOUNTER — Emergency Department (HOSPITAL_COMMUNITY)
Admission: EM | Admit: 2013-07-17 | Discharge: 2013-07-17 | Disposition: A | Discharge status: Home / Self Care | Payer: Medicaid Other | Attending: Emergency Medicine | Admitting: Emergency Medicine

## 2013-07-17 ENCOUNTER — Emergency Department (HOSPITAL_COMMUNITY): Payer: Medicaid Other

## 2013-07-17 DIAGNOSIS — M545 Low back pain, unspecified: Secondary | ICD-10-CM | POA: Insufficient documentation

## 2013-07-17 DIAGNOSIS — M5430 Sciatica, unspecified side: Secondary | ICD-10-CM

## 2013-07-17 DIAGNOSIS — M549 Dorsalgia, unspecified: Secondary | ICD-10-CM

## 2013-07-17 MED ORDER — CYCLOBENZAPRINE HCL 5 MG PO TABS: 5.0000 mg | ORAL_TABLET | Freq: Three times a day (TID) | ORAL | Status: DC | PRN

## 2013-07-17 MED ORDER — OXYCODONE-ACETAMINOPHEN 5-325 MG PO TABS: 1.0000 | ORAL_TABLET | ORAL | Status: DC | PRN

## 2013-07-17 MED ORDER — METHYLPREDNISOLONE 4 MG PO KIT: PACK | ORAL | Status: DC

## 2013-07-17 MED ORDER — IBUPROFEN 600 MG PO TABS: 600.0000 mg | ORAL_TABLET | Freq: Four times a day (QID) | ORAL | Status: DC | PRN

## 2013-07-17 MED ORDER — OXYCODONE-ACETAMINOPHEN 5-325 MG PO TABS
1.0000 | ORAL_TABLET | Freq: Once | ORAL | Status: AC
  Administered 2013-07-17: 1 via ORAL
  Filled 2013-07-17: qty 1

## 2013-07-17 MED ORDER — DIAZEPAM 5 MG PO TABS
10.0000 mg | ORAL_TABLET | Freq: Once | ORAL | Status: AC
  Administered 2013-07-17: 10 mg via ORAL
  Filled 2013-07-17: qty 2

## 2013-07-17 MED ORDER — IBUPROFEN 800 MG PO TABS
800.0000 mg | ORAL_TABLET | Freq: Once | ORAL | Status: AC
  Administered 2013-07-17: 800 mg via ORAL
  Filled 2013-07-17: qty 1

## 2013-07-17 NOTE — ED Provider Notes (Signed)
CSN: [id number]     Arrival date & time 07/17/13  1191 History   First MD Initiated Contact with Patient 07/17/13 [phone]     Chief Complaint  Patient presents with  . Back Pain   (Consider location/radiation/quality/duration/timing/severity/associated sxs/prior Treatment) HPI Pt presents with c/o low back pain.  She states the pain is in her mid lower back and radiates to both sides, radiating down both legs.  Pain is constant and worse with movement.  Pain has been constant over the past 1 week.  She feels some numbness in her little toes on both feet.  No weakness of legs,  No incontinence or retention of bowel or bladder.  No trauma.  No fever.  States she has had back pain in the past but this is lasting longer and is more intense.  There are no other associated systemic symptoms, there are no other alleviating or modifying factors.   History reviewed. No pertinent past medical history. Past Surgical History  Procedure Laterality Date  . Abdominal surgery    . Right oophorectomy     Family History  Problem Relation Age of Onset  . Cancer Mother     liver  . Anesthesia problems Father   . Heart disease Maternal Grandmother   . Seizures Paternal Grandfather    History  Substance Use Topics  . Smoking status: Never Smoker   . Smokeless tobacco: Not on file  . Alcohol Use: No   OB History   Grav Para Term Preterm Abortions TAB SAB Ect Mult Living                 Review of Systems ROS reviewed and all otherwise negative except for mentioned in HPI  Allergies  Review of patient's allergies indicates no known allergies.  Home Medications   Current Outpatient Rx  Name  Route  Sig  Dispense  Refill  . acetaminophen (TYLENOL) 500 MG tablet   Oral   Take 500-1,000 mg by mouth 2 (two) times daily as needed for pain.         . cyclobenzaprine (FLEXERIL) 5 MG tablet   Oral   Take 1 tablet (5 mg total) by mouth 3 (three) times daily as needed for muscle spasms.   20  tablet   0   . ibuprofen (ADVIL,MOTRIN) 600 MG tablet   Oral   Take 1 tablet (600 mg total) by mouth every 6 (six) hours as needed.   30 tablet   0   . methylPREDNISolone (MEDROL DOSEPAK) 4 MG tablet      Take as directed   21 tablet   0   . oxyCODONE-acetaminophen (ROXICET) 5-325 MG per tablet   Oral   Take 1-2 tablets by mouth every 4 (four) hours as needed for severe pain.   20 tablet   0    BP 123/74  Pulse 69  Temp(Src) 98.6 F (37 C) (Oral)  Resp 20  SpO2 100%  LMP 06/28/2013 Vitals reviewed Physical Exam Physical Examination: General appearance - alert, well appearing, and in no distress Mental status - alert, oriented to person, place, and time Eyes - no conjunctival injection, no scleral icterus Chest - clear to auscultation, no wheezes, rales or rhonchi, symmetric air entry Heart - normal rate, regular rhythm, normal S1, S2, no murmurs, rubs, clicks or gallops Abdomen - soft, nontender, nondistended, no masses or organomegaly Back exam - midline tenderness to palpation of lower lumbar spine, no CVA tenderness Neurological - alert, oriented x  3, strength 5/5 in extremities x 4, sensation intacta distally, 2+ DTR reflexes at knees bilaterally Extremities - peripheral pulses normal, no pedal edema, no clubbing or cyanosis Skin - normal coloration and turgor, no rashes  ED Course  Procedures (including critical care time) Labs Review Labs Reviewed - No data to display Imaging Review Dg Lumbar Spine Complete  07/17/2013   CLINICAL DATA:  Low back pain x1 week without history of trauma.  EXAM: LUMBAR SPINE - COMPLETE 4+ VIEW  COMPARISON:  Lumbar spine series dated November 08, 2012.  FINDINGS: The lumbar vertebral bodies are preserved in height. The intervertebral disc space heights are reasonably well maintained though there is mild chronic loss of height at L4-5. There is no evidence of a pars defect nor spondylolisthesis. No significant facet joint degenerative  change is demonstrated. The pedicles and transverse processes appear intact. The observed portions of the sacrum are normal.  IMPRESSION: There is no acute bony abnormality of the lumbar spine. Mild disc space narrowing at L4-5 is present similar to that seen previously.   Electronically Signed   By: David  [place]   On: 07/17/2013 08:47    EKG Interpretation   None       MDM   1. Back pain   2. Sciatica, unspecified laterality    Pt presenting with low back pain, radiation down legs, some numbness to 5th toes bilaterally, strength 5/5 in lower extremities.  Xray obtained and reassuring.  Shows some disc space narrowing at L4-L5 which is not significantly changed from prior .  Given pain control in the ED-will also discharge with course of steroids and neurosurgery followup as this has been a recurring problem for her.  Discharged with strict return precautions.  Pt agreeable with plan.    Ethelda Chick, MD 07/17/13 [phone]

## 2013-07-17 NOTE — ED Notes (Signed)
Pt c/o lower back pain x 1 week now states the pain is radiating down legs and toes are becoming numb. Pt state she has always had problems with her back but states this feels different.

## 2013-08-09 ENCOUNTER — Other Ambulatory Visit: Payer: Self-pay | Admitting: Neurosurgery

## 2013-08-09 ENCOUNTER — Ambulatory Visit
Admission: RE | Admit: 2013-08-09 | Discharge: 2013-08-09 | Disposition: A | Discharge status: Home / Self Care | Payer: Medicaid Other | Source: Ambulatory Visit | Attending: Neurosurgery | Admitting: Neurosurgery

## 2013-08-09 ENCOUNTER — Encounter: Payer: Self-pay | Admitting: Obstetrics

## 2013-08-09 DIAGNOSIS — M545 Low back pain, unspecified: Secondary | ICD-10-CM

## 2013-09-06 ENCOUNTER — Encounter (HOSPITAL_COMMUNITY): Payer: Self-pay | Admitting: Emergency Medicine

## 2013-09-06 DIAGNOSIS — M5126 Other intervertebral disc displacement, lumbar region: Secondary | ICD-10-CM | POA: Insufficient documentation

## 2013-09-06 NOTE — ED Notes (Signed)
Pt. reports progressing / worsening low back pain radiating to right leg for several days , denies injury or fall / ambulatory . Pt. stated history of sciatica and herniated disc .

## 2013-09-07 ENCOUNTER — Emergency Department (HOSPITAL_COMMUNITY)
Admission: EM | Admit: 2013-09-07 | Discharge: 2013-09-07 | Disposition: A | Discharge status: Home / Self Care | Payer: Medicaid Other | Attending: Emergency Medicine | Admitting: Emergency Medicine

## 2013-09-07 DIAGNOSIS — IMO0002 Reserved for concepts with insufficient information to code with codable children: Secondary | ICD-10-CM

## 2013-09-07 DIAGNOSIS — M5416 Radiculopathy, lumbar region: Secondary | ICD-10-CM

## 2013-09-07 HISTORY — DX: Sciatica, unspecified side: M54.30

## 2013-09-07 HISTORY — DX: Reserved for concepts with insufficient information to code with codable children: IMO0002

## 2013-09-07 MED ORDER — HYDROCODONE-ACETAMINOPHEN 5-325 MG PO TABS: 1.0000 | ORAL_TABLET | Freq: Four times a day (QID) | ORAL | Status: DC | PRN

## 2013-09-07 MED ORDER — DIAZEPAM 5 MG PO TABS
5.0000 mg | ORAL_TABLET | Freq: Once | ORAL | Status: AC
  Administered 2013-09-07: 5 mg via ORAL
  Filled 2013-09-07: qty 1

## 2013-09-07 MED ORDER — DIAZEPAM 5 MG PO TABS: 5.0000 mg | ORAL_TABLET | Freq: Three times a day (TID) | ORAL | Status: DC | PRN

## 2013-09-07 MED ORDER — PREDNISONE 20 MG PO TABS: ORAL_TABLET | ORAL | Status: DC

## 2013-09-07 MED ORDER — KETOROLAC TROMETHAMINE 30 MG/ML IJ SOLN
30.0000 mg | Freq: Once | INTRAMUSCULAR | Status: AC
  Administered 2013-09-07: 30 mg via INTRAMUSCULAR
  Filled 2013-09-07: qty 1

## 2013-09-07 MED ORDER — PREDNISONE 20 MG PO TABS
60.0000 mg | ORAL_TABLET | Freq: Once | ORAL | Status: AC
  Administered 2013-09-07: 60 mg via ORAL
  Filled 2013-09-07: qty 3

## 2013-09-07 NOTE — Discharge Instructions (Signed)
Is called Dr. Mikal Planeabell, and make an appointment for further treatment in the meantime, You have been given a prescription for 3 different medications one is a muscle relaxer called gallium.  Please take as directed.  One is prednisone, which is a very potent anti-inflammatory, as well as Vicodin, which is a narcotic pain medication

## 2013-09-07 NOTE — ED Provider Notes (Signed)
CSN: [id number]     Arrival date & time 09/06/13  2340 History   First MD Initiated Contact with Patient 09/07/13 0229     Chief Complaint  Patient presents with  . Back Pain     (Consider location/radiation/quality/duration/timing/severity/associated sxs/prior Treatment) HPI Comments: Patient has been seen by Dr. Fabiola Backer from neurosurgery, who ordered an MRI of her lumbar spine.  On January 15, was sure she has a disc herniation.  She has not called for followup, appointment, she's not been taking any medication.  She states that when she turns in certain positions.  Pain goes down her leg.  If she is lying still she spine. Per the patient.  The plan is for epidural injections.  If these don't work then consideration for surgery.  Will be discussed with Dr. Fabiola Backer Patient denies any fall, incontinence, inability to ambulate  Patient is a 43 y.o. female presenting with back pain.  Back Pain Associated symptoms: no abdominal pain, no fever, no numbness, no pelvic pain and no weakness     Past Medical History  Diagnosis Date  . Sciatica   . Herniated disc    Past Surgical History  Procedure Laterality Date  . Abdominal surgery    . Right oophorectomy     Family History  Problem Relation Age of Onset  . Cancer Mother     liver  . Anesthesia problems Father   . Heart disease Maternal Grandmother   . Seizures Paternal Grandfather    History  Substance Use Topics  . Smoking status: Never Smoker   . Smokeless tobacco: Not on file  . Alcohol Use: No   OB History   Grav Para Term Preterm Abortions TAB SAB Ect Mult Living                 Review of Systems  Constitutional: Negative for fever, activity change and appetite change.  Respiratory: Negative for shortness of breath.   Gastrointestinal: Negative for abdominal pain.  Genitourinary: Negative for frequency, difficulty urinating, vaginal pain and pelvic pain.  Musculoskeletal: Positive for back pain.  Skin: Negative for  rash and wound.  Neurological: Negative for weakness and numbness.  All other systems reviewed and are negative.      Allergies  Review of patient's allergies indicates no known allergies.  Home Medications  No current outpatient prescriptions on file. BP 128/79  Pulse 83  Temp(Src) 97.9 F (36.6 C) (Oral)  Resp 16  Ht 5\' 5"  (1.651 m)  Wt 214 lb 14.4 oz (97.478 kg)  BMI 35.76 kg/m2  SpO2 98%  LMP 08/14/2013 Physical Exam  Nursing note and vitals reviewed. Constitutional: She is oriented to person, place, and time. She appears well-developed and well-nourished.  Eyes: Pupils are equal, round, and reactive to light.  Neck: Normal range of motion.  Cardiovascular: Normal rate.   Pulmonary/Chest: Effort normal.  Abdominal: Soft. She exhibits no distension.  Musculoskeletal: Normal range of motion. She exhibits no edema and no tenderness.       Back:  Neurological: She is alert and oriented to person, place, and time.  Skin: Skin is warm. No rash noted. No erythema.    ED Course  Procedures (including critical care time) Labs Review Labs Reviewed - No data to display Imaging Review No results found.  EKG Interpretation   None       MDM   Final diagnoses:  None     I've encouraged the patient to call Dr. Mikal Plane to set appointment  to discuss treatment modalities since her MRI scan has been done for approximately one month.  Tonight.  He will give her IM Toradol.  By mouth Valium, and by mouth steroid dose in her home with a short course of these medications until she can see Dr. Phillis Haggisabell    Jaselle Pryer K Lakie Mclouth, NP 09/07/13 [phone]

## 2013-09-07 NOTE — ED Notes (Signed)
Pt c/o pain in lower back that radiates across her buttocks and into her legs, Pt has history of sciatica

## 2013-09-07 NOTE — ED Provider Notes (Signed)
Medical screening examination/treatment/procedure(s) were performed by non-physician practitioner and as supervising physician I was immediately available for consultation/collaboration.   Shaunna Rosetti, MD 09/07/13 0706 

## 2013-10-04 ENCOUNTER — Other Ambulatory Visit: Payer: Self-pay | Admitting: *Deleted

## 2013-10-04 DIAGNOSIS — N939 Abnormal uterine and vaginal bleeding, unspecified: Secondary | ICD-10-CM

## 2013-10-04 MED ORDER — MEDROXYPROGESTERONE ACETATE 10 MG PO TABS
10.0000 mg | ORAL_TABLET | Freq: Every day | ORAL | Status: DC
Start: 2013-10-04 — End: 2013-12-20

## 2013-10-05 ENCOUNTER — Encounter: Payer: Self-pay | Admitting: Obstetrics

## 2013-12-20 ENCOUNTER — Encounter (HOSPITAL_COMMUNITY): Payer: Self-pay | Admitting: Emergency Medicine

## 2013-12-20 ENCOUNTER — Emergency Department (HOSPITAL_COMMUNITY)
Admission: EM | Admit: 2013-12-20 | Discharge: 2013-12-20 | Disposition: A | Discharge status: Home / Self Care | Payer: Medicaid Other | Attending: Emergency Medicine | Admitting: Emergency Medicine

## 2013-12-20 DIAGNOSIS — M62838 Other muscle spasm: Secondary | ICD-10-CM | POA: Insufficient documentation

## 2013-12-20 DIAGNOSIS — M549 Dorsalgia, unspecified: Secondary | ICD-10-CM | POA: Insufficient documentation

## 2013-12-20 DIAGNOSIS — Z79899 Other long term (current) drug therapy: Secondary | ICD-10-CM | POA: Insufficient documentation

## 2013-12-20 DIAGNOSIS — IMO0002 Reserved for concepts with insufficient information to code with codable children: Secondary | ICD-10-CM | POA: Insufficient documentation

## 2013-12-20 DIAGNOSIS — M25519 Pain in unspecified shoulder: Secondary | ICD-10-CM | POA: Insufficient documentation

## 2013-12-20 MED ORDER — TRAMADOL HCL 50 MG PO TABS: 50.0000 mg | ORAL_TABLET | Freq: Four times a day (QID) | ORAL | Status: DC | PRN

## 2013-12-20 MED ORDER — NAPROXEN 500 MG PO TABS: 500.0000 mg | ORAL_TABLET | Freq: Two times a day (BID) | ORAL | Status: DC

## 2013-12-20 NOTE — Discharge Instructions (Signed)
Please read and follow all provided instructions.  Your diagnoses today include:  1. Muscle spasms of neck     Tests performed today include:  Vital signs - see below for your results today  Medications prescribed:   Tramadol - narcotic-like pain medication  DO NOT drive or perform any activities that require you to be awake and alert because this medicine can make you drowsy.    Naproxen - anti-inflammatory pain medication  Do not exceed 500mg  naproxen every 12 hours, take with food  You have been prescribed an anti-inflammatory medication or NSAID. Take with food. Take smallest effective dose for the shortest duration needed for your pain. Stop taking if you experience stomach pain or vomiting.   Take any prescribed medications only as directed.  Home care instructions:   Follow any educational materials contained in this packet  Please rest, use ice or heat on your back for the next several days  Do not lift, push, pull anything more than 10 pounds for the next week  Follow-up instructions: Please follow-up with your primary care provider in the next 1 week for further evaluation of your symptoms. If you do not have a primary care doctor -- see below for referral information.   Return instructions:  SEEK IMMEDIATE MEDICAL ATTENTION IF YOU HAVE:  New numbness, tingling, weakness, or problem with the use of your arms or legs  Severe back pain not relieved with medications  Loss control of your bowels or bladder  Increasing pain in any areas of the body (such as chest or abdominal pain)  Shortness of breath, dizziness, or fainting.   Worsening nausea (feeling sick to your stomach), vomiting, fever, or sweats  Any other emergent concerns regarding your health   Additional Information:  Your vital signs today were: BP 138/88   Pulse 88   Temp(Src) 98 F (36.7 C) (Oral)   Resp 16   SpO2 100% If your blood pressure (BP) was elevated above 135/85 this visit,  please have this repeated by your doctor within one month. --------------

## 2013-12-20 NOTE — ED Notes (Signed)
Pt states that she was reaching down to the shelf to get some candy on Monday.  Felt a pain in her right shoulder/neck.  Went to have a massage yesterday to help the pain but is now having burning to rt neck and shoulder.

## 2013-12-20 NOTE — ED Provider Notes (Signed)
CSN: [id number]     Arrival date & time 12/20/13  9326 History   First MD Initiated Contact with Patient 12/20/13 1003     Chief Complaint  Patient presents with  . Neck Pain  . Shoulder Pain     (Consider location/radiation/quality/duration/timing/severity/associated sxs/prior Treatment) HPI Comments: Patient with history of disc herniation in lower back -- presents with complaint of right neck pain that radiates into her right shoulder and into her right back. Pain began acutely after patient bent over and reached with her R arm, 3 days ago. Patient denies numbness or tingling. She denies weakness in her arms or legs. She denies lower extremity symptoms including urinary retention/incontinence, fecal incontinence. Patient has been taking Bayer aspirin, left over prednisone and diazepam for her symptoms however these medications only make her drowsy. She went and had a massage yesterday but this did not really help. Pain is described as tight and burning. Movement makes pain worse. Nothing makes it better.  The history is provided by the patient.    Past Medical History  Diagnosis Date  . Sciatica   . Herniated disc    Past Surgical History  Procedure Laterality Date  . Abdominal surgery    . Right oophorectomy     Family History  Problem Relation Age of Onset  . Cancer Mother     liver  . Anesthesia problems Father   . Heart disease Maternal Grandmother   . Seizures Paternal Grandfather    History  Substance Use Topics  . Smoking status: Never Smoker   . Smokeless tobacco: Not on file  . Alcohol Use: No   OB History   Grav Para Term Preterm Abortions TAB SAB Ect Mult Living                 Review of Systems  Constitutional: Negative for fever and unexpected weight change.  Gastrointestinal: Negative for constipation.       Negative for fecal incontinence.   Genitourinary: Negative for enuresis.       Negative for urinary incontinence or retention.    Musculoskeletal: Positive for back pain, myalgias, neck pain and neck stiffness. Negative for arthralgias.  Neurological: Negative for weakness and numbness.       Denies saddle paresthesias.      Allergies  Review of patient's allergies indicates no known allergies.  Home Medications   Prior to Admission medications   Medication Sig Start Date End Date Taking? Authorizing Provider  diazepam (VALIUM) 5 MG tablet Take 1 tablet (5 mg total) by mouth every 8 (eight) hours as needed for anxiety. 09/07/13   Arman Filter, NP  HYDROcodone-acetaminophen (NORCO/VICODIN) 5-325 MG per tablet Take 1 tablet by mouth every 6 (six) hours as needed. 09/07/13   Arman Filter, NP  medroxyPROGESTERone (PROVERA) 10 MG tablet Take 1 tablet (10 mg total) by mouth daily. 10/04/13   Brock Bad, MD  predniSONE (DELTASONE) 20 MG tablet 3 Tabs PO Days 1-3, then 2 tabs PO Days 4-6, then 1 tab PO Day 7-9, then Half Tab PO Day 10-12 09/07/13   Arman Filter, NP   BP 138/88  Pulse 88  Temp(Src) 98 F (36.7 C) (Oral)  Resp 16  SpO2 100% Physical Exam  Nursing note and vitals reviewed. Constitutional: She appears well-developed and well-nourished.  HENT:  Head: Normocephalic and atraumatic.  Eyes: Conjunctivae are normal.  Neck: Neck supple. Normal carotid pulses present. No spinous process tenderness present. No rigidity. Decreased range of  motion (Especially with rotation ) present. No edema present.  No meningismus.  Cardiovascular:  Pulses:      Radial pulses are 2+ on the right side, and 2+ on the left side.  Pulmonary/Chest: Effort normal.  Abdominal: Soft. There is no tenderness. There is no CVA tenderness.  Musculoskeletal:       Cervical back: She exhibits decreased range of motion and tenderness. She exhibits no bony tenderness.       Thoracic back: She exhibits tenderness. She exhibits normal range of motion and no bony tenderness.       Lumbar back: She exhibits normal range of motion, no  tenderness and no bony tenderness.       Back:  No step-off noted with palpation of spine.   Neurological: She is alert. She has normal strength and normal reflexes. No sensory deficit.  5/5 strength in entire upper and lower extremities bilaterally. No sensation deficits noted.  Skin: Skin is warm and dry. No rash noted.  Psychiatric: She has a normal mood and affect.    ED Course  Procedures (including critical care time) Labs Review Labs Reviewed - No data to display  Imaging Review No results found.   EKG Interpretation None      10:26 AM Patient seen and examined.   Vital signs reviewed and are as follows: Filed Vitals:   12/20/13 0954  BP: 138/88  Pulse: 88  Temp: 98 F (36.7 C)  Resp: 16    No red flag s/s of back pain. Patient was counseled on back pain precautions and told to do activity as tolerated but do not lift, push, or pull heavy objects more than 10 pounds for the next week.  Patient counseled to use ice or heat on back for no longer than 15 minutes every hour.   She will continue diazepam she has at home. I told her I do not think the prednisone will be helpful at this point.   Patient prescribed narcotic pain medicine and counseled on proper use of narcotic pain medications. Counseled not to combine this medication with others containing tylenol.   Urged patient not to drink alcohol, drive, or perform any other activities that requires focus while taking either of these medications.  Patient urged to follow-up with PCP if pain does not improve with treatment and rest or if pain becomes recurrent. Urged to return with worsening severe pain, loss of bowel or bladder control, trouble walking.   The patient verbalizes understanding and agrees with the plan.   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 Patient does not have any gross neurological deficits on exam today. She does not have  any lower extremity symptoms to really suggest a central cord syndrome. No red flag signs and symptoms. No systemic symptoms of illness. I do not feel imaging is necessary at this point. Will continue to treat conservatively and have patient rest the area.     Renne CriglerJoshua Cherilynn Schomburg, PA-C 12/20/13 1051

## 2013-12-23 NOTE — ED Provider Notes (Signed)
Medical screening examination/treatment/procedure(s) were performed by non-physician practitioner and as supervising physician I was immediately available for consultation/collaboration.   EKG Interpretation None        Merrie Roof, MD 12/23/13 2045

## 2013-12-28 ENCOUNTER — Other Ambulatory Visit: Payer: Self-pay | Admitting: *Deleted

## 2013-12-28 ENCOUNTER — Telehealth: Payer: Self-pay | Admitting: *Deleted

## 2013-12-28 DIAGNOSIS — R399 Unspecified symptoms and signs involving the genitourinary system: Secondary | ICD-10-CM

## 2013-12-28 MED ORDER — SULFAMETHOXAZOLE-TMP DS 800-160 MG PO TABS: 1.0000 | ORAL_TABLET | Freq: Two times a day (BID) | ORAL | Status: DC

## 2013-12-28 NOTE — Telephone Encounter (Signed)
Pt called in to office requesting medication for UTI symptoms.  Pt states that she has been having pain, pressure and some bleeding with urination.   Pt made aware that Rx could be sent to pharmacy per nursing protocol.  Bactrim DS sent to pharmacy.

## 2014-01-28 ENCOUNTER — Telehealth: Payer: Self-pay | Admitting: *Deleted

## 2014-01-28 NOTE — Telephone Encounter (Signed)
Please call patient to schedule.

## 2014-01-28 NOTE — Telephone Encounter (Signed)
Needs office consultation.

## 2014-01-28 NOTE — Telephone Encounter (Signed)
Patient is calling to request a couple referrals for some problems that she is having. Patient wants a referral to a ENT due to chronic problems with her sinus and coughing a thick mucus. Patient also wants to see a GI for her stomch- patient states she has nausea and bloating every time she eats. We are listed as primary on her insurance and so she wants us to refer.

## 2014-02-06 NOTE — Telephone Encounter (Signed)
[id number] - 4:37pm - brm - made appointment for [id number] with Dr. Clearance CootsHarper

## 2014-02-08 ENCOUNTER — Encounter (HOSPITAL_COMMUNITY): Payer: Self-pay | Admitting: Emergency Medicine

## 2014-02-08 ENCOUNTER — Emergency Department (HOSPITAL_COMMUNITY)
Admission: EM | Admit: 2014-02-08 | Discharge: 2014-02-08 | Disposition: A | Discharge status: Home / Self Care | Payer: Medicaid Other | Attending: Emergency Medicine | Admitting: Emergency Medicine

## 2014-02-08 DIAGNOSIS — IMO0002 Reserved for concepts with insufficient information to code with codable children: Secondary | ICD-10-CM | POA: Diagnosis not present

## 2014-02-08 DIAGNOSIS — Z792 Long term (current) use of antibiotics: Secondary | ICD-10-CM | POA: Diagnosis not present

## 2014-02-08 DIAGNOSIS — J0100 Acute maxillary sinusitis, unspecified: Secondary | ICD-10-CM | POA: Insufficient documentation

## 2014-02-08 DIAGNOSIS — Z8739 Personal history of other diseases of the musculoskeletal system and connective tissue: Secondary | ICD-10-CM | POA: Diagnosis not present

## 2014-02-08 DIAGNOSIS — R0982 Postnasal drip: Secondary | ICD-10-CM

## 2014-02-08 DIAGNOSIS — R221 Localized swelling, mass and lump, neck: Secondary | ICD-10-CM | POA: Diagnosis present

## 2014-02-08 DIAGNOSIS — R220 Localized swelling, mass and lump, head: Secondary | ICD-10-CM | POA: Diagnosis present

## 2014-02-08 LAB — RAPID STREP SCREEN (MED CTR MEBANE ONLY): Streptococcus, Group A Screen (Direct): NEGATIVE

## 2014-02-08 MED ORDER — LIDOCAINE VISCOUS 2 % MT SOLN: 15.0000 mL | OROMUCOSAL | Status: DC | PRN

## 2014-02-08 MED ORDER — LIDOCAINE VISCOUS 2 % MT SOLN
20.0000 mL | Freq: Once | OROMUCOSAL | Status: AC
  Administered 2014-02-08: 20 mL via OROMUCOSAL
  Filled 2014-02-08: qty 30

## 2014-02-08 MED ORDER — DEXAMETHASONE SODIUM PHOSPHATE 10 MG/ML IJ SOLN
10.0000 mg | Freq: Once | INTRAMUSCULAR | Status: AC
  Administered 2014-02-08: 10 mg via INTRAMUSCULAR
  Filled 2014-02-08: qty 1

## 2014-02-08 MED ORDER — AMOXICILLIN 500 MG PO CAPS: 500.0000 mg | ORAL_CAPSULE | Freq: Three times a day (TID) | ORAL | Status: DC

## 2014-02-08 MED ORDER — FLUTICASONE PROPIONATE 50 MCG/ACT NA SUSP: 2.0000 | Freq: Every day | NASAL | Status: DC

## 2014-02-08 MED ORDER — AMOXICILLIN 500 MG PO CAPS
500.0000 mg | ORAL_CAPSULE | Freq: Once | ORAL | Status: AC
  Administered 2014-02-08: 500 mg via ORAL
  Filled 2014-02-08: qty 1

## 2014-02-08 NOTE — Discharge Instructions (Signed)
Take your antibiotics as directed and to completion. You should never have any leftover antibiotics! Push fluids and stay well hydrated.   Use nasal saline (you can try [employer] Simply Saline) at least 4 times a day, use saline 5-10 minutes before using the fluticasone (flonase)  Do not use Afrin (Oxymetazoline)  Rest, wash hands frequently  and drink plenty of water.  Please follow with your primary care doctor in the next 2 days for a check-up. They must obtain records for further management.   Do not hesitate to return to the Emergency Department for any new, worsening or concerning symptoms.   Sinusitis Sinusitis is redness, soreness, and swelling (inflammation) of the paranasal sinuses. Paranasal sinuses are air pockets within the bones of your face (beneath the eyes, the middle of the forehead, or above the eyes). In healthy paranasal sinuses, mucus is able to drain out, and air is able to circulate through them by way of your nose. However, when your paranasal sinuses are inflamed, mucus and air can become trapped. This can allow bacteria and other germs to grow and cause infection. Sinusitis can develop quickly and last only a short time (acute) or continue over a long period (chronic). Sinusitis that lasts for more than 12 weeks is considered chronic.  CAUSES  Causes of sinusitis include:  Allergies.  Structural abnormalities, such as displacement of the cartilage that separates your nostrils (deviated septum), which can decrease the air flow through your nose and sinuses and affect sinus drainage.  Functional abnormalities, such as when the small hairs (cilia) that line your sinuses and help remove mucus do not work properly or are not present. SYMPTOMS  Symptoms of acute and chronic sinusitis are the same. The primary symptoms are pain and pressure around the affected sinuses. Other symptoms include:  Upper toothache.  Earache.  Headache.  Bad breath.  Decreased  sense of smell and taste.  A cough, which worsens when you are lying flat.  Fatigue.  Fever.  Thick drainage from your nose, which often is green and may contain pus (purulent).  Swelling and warmth over the affected sinuses. DIAGNOSIS  Your caregiver will perform a physical exam. During the exam, your caregiver may:  Look in your nose for signs of abnormal growths in your nostrils (nasal polyps).  Tap over the affected sinus to check for signs of infection.  View the inside of your sinuses (endoscopy) with a special imaging device with a light attached (endoscope), which is inserted into your sinuses. If your caregiver suspects that you have chronic sinusitis, one or more of the following tests may be recommended:  Allergy tests.  Nasal culture--A sample of mucus is taken from your nose and sent to a lab and screened for bacteria.  Nasal cytology--A sample of mucus is taken from your nose and examined by your caregiver to determine if your sinusitis is related to an allergy. TREATMENT  Most cases of acute sinusitis are related to a viral infection and will resolve on their own within 10 days. Sometimes medicines are prescribed to help relieve symptoms (pain medicine, decongestants, nasal steroid sprays, or saline sprays).  However, for sinusitis related to a bacterial infection, your caregiver will prescribe antibiotic medicines. These are medicines that will help kill the bacteria causing the infection.  Rarely, sinusitis is caused by a fungal infection. In theses cases, your caregiver will prescribe antifungal medicine. For some cases of chronic sinusitis, surgery is needed. Generally, these are cases in which sinusitis  recurs more than 3 times per year, despite other treatments. HOME CARE INSTRUCTIONS   Drink plenty of water. Water helps thin the mucus so your sinuses can drain more easily.  Use a humidifier.  Inhale steam 3 to 4 times a day (for example, sit in the bathroom  with the shower running).  Apply a warm, moist washcloth to your face 3 to 4 times a day, or as directed by your caregiver.  Use saline nasal sprays to help moisten and clean your sinuses.  Take over-the-counter or prescription medicines for pain, discomfort, or fever only as directed by your caregiver. SEEK IMMEDIATE MEDICAL CARE IF:  You have increasing pain or severe headaches.  You have nausea, vomiting, or drowsiness.  You have swelling around your face.  You have vision problems.  You have a stiff neck.  You have difficulty breathing. MAKE SURE YOU:   Understand these instructions.  Will watch your condition.  Will get help right away if you are not doing well or get worse. Document Released: 07/12/2005 Document Revised: 10/04/2011 Document Reviewed: 07/27/2011 [facility] Patient Information 2015 [place], [place]. This information is not intended to replace advice given to you by your health care provider. Make sure you discuss any questions you have with your health care provider.

## 2014-02-08 NOTE — ED Notes (Signed)
Pt able to speak but during assessment, pt wanting to not speak but instead is writing down responses on paper for RN and PA at read.

## 2014-02-08 NOTE — ED Provider Notes (Signed)
CSN: [id number]     Arrival date & time 02/08/14  0541 History   None    Chief Complaint  Patient presents with  . Oral Swelling     (Consider location/radiation/quality/duration/timing/severity/associated sxs/prior Treatment) HPI  Alisha Torres is a 43 y.o. female complaining of burning in her throat onset yesterday. Patient is also complaining of right lymph node swelling, right maxillary sinus pressure. She was taking over-the-counter sinus medication and gargling with saltwater gargles she states that exacerbates the discomfort. Patient denies fever, drooling, endorses a dry cough. Patient denies shortness of breath to me. Note that this contradicts the triage note.  Past Medical History  Diagnosis Date  . Sciatica   . Herniated disc    Past Surgical History  Procedure Laterality Date  . Abdominal surgery    . Right oophorectomy     Family History  Problem Relation Age of Onset  . Cancer Mother     liver  . Anesthesia problems Father   . Heart disease Maternal Grandmother   . Seizures Paternal Grandfather    History  Substance Use Topics  . Smoking status: Never Smoker   . Smokeless tobacco: Not on file  . Alcohol Use: No   OB History   Grav Para Term Preterm Abortions TAB SAB Ect Mult Living                 Review of Systems  10 systems reviewed and found to be negative, except as noted in the HPI.   Allergies  Review of patient's allergies indicates no known allergies.  Home Medications   Prior to Admission medications   Medication Sig Start Date End Date Taking? Authorizing Provider  amoxicillin (AMOXIL) 500 MG capsule Take 1 capsule (500 mg total) by mouth 3 (three) times daily. 02/08/14   Delray Reza, PA-C  fluticasone (FLONASE) 50 MCG/ACT nasal spray Place 2 sprays into both nostrils daily. 02/08/14   Garnett Rekowski, PA-C  lidocaine (XYLOCAINE) 2 % solution Use as directed 15 mLs in the mouth or throat every 3 (three) hours as needed. 02/08/14    Kimberlie Csaszar, PA-C   BP 139/93  Pulse 62  Temp(Src) 98.8 F (37.1 C) (Oral)  Resp 16  Ht 5' 5.5" (1.664 m)  Wt 213 lb (96.616 kg)  BMI 34.89 kg/m2  SpO2 99%  LMP 01/25/2014 Physical Exam  Nursing note and vitals reviewed. Constitutional: She is oriented to person, place, and time. She appears well-developed and well-nourished. No distress.  HENT:  Head: Normocephalic.  Mouth/Throat: Oropharynx is clear and moist. No oropharyngeal exudate.  No drooling or stridor. Posterior pharynx mildly injected; no significant tonsillar hypertrophy. No exudate. Soft palate rises symmetrically. No TTP or induration under tongue.   Right maxillary sinus with mild tenderness to palpation  Mild mucosal edema in the nares.  Bilateral tympanic membranes with normal architecture and good light reflex.    Eyes: Conjunctivae and EOM are normal.  Neck:  Shotty, tender anterior cervical lymphadenopathy worse on the right than the left  Cardiovascular: Normal rate, regular rhythm and intact distal pulses.   Pulmonary/Chest: Effort normal and breath sounds normal. No stridor. No respiratory distress. She has no wheezes. She has no rales. She exhibits no tenderness.  Abdominal: Soft. Bowel sounds are normal. She exhibits no distension and no mass. There is no tenderness. There is no rebound and no guarding.  Musculoskeletal: Normal range of motion.  Lymphadenopathy:    She has cervical adenopathy.  Neurological: She is alert and  oriented to person, place, and time.  Psychiatric: She has a normal mood and affect.    ED Course  Procedures (including critical care time) Labs Review Labs Reviewed  RAPID STREP SCREEN  CULTURE, GROUP A STREP    Imaging Review No results found.   EKG Interpretation None      MDM   Final diagnoses:  Acute maxillary sinusitis, recurrence not specified  Post-nasal drip    Filed Vitals:   02/08/14 0545 02/08/14 0547 02/08/14 0630  BP: 150/99 150/99  139/93  Pulse:  66 62  Temp:  98.8 F (37.1 C)   TempSrc:  Oral   Resp:  16   Height:  5' 5.5" (1.664 m)   Weight:  213 lb (96.616 kg)   SpO2:  100% 99%    Medications  dexamethasone (DECADRON) injection 10 mg (10 mg Intramuscular Given 02/08/14 0704)  amoxicillin (AMOXIL) capsule 500 mg (500 mg Oral Given 02/08/14 0711)  lidocaine (XYLOCAINE) 2 % viscous mouth solution 20 mL (20 mLs Mouth/Throat Given 02/08/14 0701)    Alisha Torres is a 43 y.o. female presenting with burning throat and sinus pressure. Patient afebrile. No tonsillar hypertrophy or exudate. Patient him when their secretions. No signs of peritonsillar abscess. Will treat for sinusitis.   Evaluation does not show pathology that would require ongoing emergent intervention or inpatient treatment. Pt is hemodynamically stable and mentating appropriately. Discussed findings and plan with patient/guardian, who agrees with care plan. All questions answered. Return precautions discussed and outpatient follow up given.   Discharge Medication List as of 02/08/2014  7:03 AM    START taking these medications   Details  amoxicillin (AMOXIL) 500 MG capsule Take 1 capsule (500 mg total) by mouth 3 (three) times daily., Starting 02/08/2014, Until Discontinued, Print    fluticasone (FLONASE) 50 MCG/ACT nasal spray Place 2 sprays into both nostrils daily., Starting 02/08/2014, Until Discontinued, Print    lidocaine (XYLOCAINE) 2 % solution Use as directed 15 mLs in the mouth or throat every 3 (three) hours as needed., Starting 02/08/2014, Until Discontinued, [employer], PA-C 02/08/14 [phone]

## 2014-02-08 NOTE — ED Notes (Signed)
Pt from home, [facility], reports she had a sore throat last night, gargles warm salt water then woke up this morning around 0500 and her throat was swollen and reports SOB. Right tonsil appears swollen, airway intact, able to speak softly but reports burning with breathing in and talking. A&Ox4. O2-99% RA.

## 2014-02-08 NOTE — ED Notes (Signed)
PA at bedside.

## 2014-02-08 NOTE — ED Notes (Signed)
Pt A&OX4,  Ambulatory at d/c with steady gait, NAD

## 2014-02-09 NOTE — ED Provider Notes (Signed)
Medical screening examination/treatment/procedure(s) were performed by non-physician practitioner and as supervising physician I was immediately available for consultation/collaboration.   EKG Interpretation None        Zamyra Allensworth H Marleen Moret, MD 02/09/14 1117 

## 2014-02-10 LAB — CULTURE, GROUP A STREP

## 2014-02-20 ENCOUNTER — Institutional Professional Consult (permissible substitution): Payer: Medicaid Other | Admitting: Obstetrics

## 2014-04-05 ENCOUNTER — Ambulatory Visit: Payer: Medicaid Other | Admitting: Obstetrics & Gynecology

## 2014-04-25 ENCOUNTER — Ambulatory Visit (INDEPENDENT_AMBULATORY_CARE_PROVIDER_SITE_OTHER): Payer: Medicaid Other | Admitting: Obstetrics & Gynecology

## 2014-04-25 VITALS — BP 155/98 | HR 73 | Temp 98.3°F | Wt 228.0 lb

## 2014-04-25 DIAGNOSIS — N6452 Nipple discharge: Secondary | ICD-10-CM

## 2014-04-25 DIAGNOSIS — Z3202 Encounter for pregnancy test, result negative: Secondary | ICD-10-CM

## 2014-04-26 ENCOUNTER — Encounter: Payer: Self-pay | Admitting: Obstetrics & Gynecology

## 2014-04-26 ENCOUNTER — Telehealth: Payer: Self-pay

## 2014-04-26 ENCOUNTER — Other Ambulatory Visit: Payer: Self-pay | Admitting: Obstetrics & Gynecology

## 2014-04-26 DIAGNOSIS — N6452 Nipple discharge: Secondary | ICD-10-CM

## 2014-04-26 NOTE — Progress Notes (Signed)
Patient ID: Alisha Torres, female   DOB: 1971-05-29, 43 y.o.   MRN: [id number]  Chief Complaint  Patient presents with  . Breast Problem    HPI [name] is a 43 y.o. female.  She reports a clear discharge from the right breast 1 week ago after manipulation of the breast.  This was self-limited.  No mass, trauma or pain.  HPI  Past Medical History  Diagnosis Date  . Sciatica   . Herniated disc     Past Surgical History  Procedure Laterality Date  . Abdominal surgery    . Right oophorectomy      Family History  Problem Relation Age of Onset  . Cancer Mother     liver  . Anesthesia problems Father   . Heart disease Maternal Grandmother   . Seizures Paternal Grandfather     Social History History  Substance Use Topics  . Smoking status: Never Smoker   . Smokeless tobacco: Not on file  . Alcohol Use: No    No Known Allergies  Current Outpatient Prescriptions  Medication Sig Dispense Refill  . amoxicillin (AMOXIL) 500 MG capsule Take 1 capsule (500 mg total) by mouth 3 (three) times daily.  21 capsule  0  . fluticasone (FLONASE) 50 MCG/ACT nasal spray Place 2 sprays into both nostrils daily.  16 g  0  . lidocaine (XYLOCAINE) 2 % solution Use as directed 15 mLs in the mouth or throat every 3 (three) hours as needed.  100 mL  0   No current facility-administered medications for this visit.    Review of Systems Review of Systems Constitutional: negative for fatigue and weight loss Respiratory: negative for cough and wheezing Cardiovascular: negative for chest pain, fatigue and palpitations Gastrointestinal: positive for nausea, abdominal pain Genitourinary:negative for abnormal vaginal discharge Integument/breast: positive for nipple discharge Musculoskeletal:negative for myalgias Neurological: negative for gait problems and tremors Behavioral/Psych: negative for abusive relationship, depression Endocrine: negative for temperature intolerance     Blood  pressure 155/98, pulse 73, temperature 98.3 F (36.8 C), weight 103.42 kg (228 lb), last menstrual period 03/21/2014.  Physical Exam Physical Exam General:   alert  Skin:   no rash or abnormalities  Lungs:   clear to auscultation bilaterally  Heart:   regular rate and rhythm, S1, S2 normal, no murmur, click, rub or gallop  Breasts:   normal without suspicious masses, skin or nipple changes or axillary nodes      Data Reviewed None Assessment    Breast discharge--not spontaneous Normal exam    Plan   Pt reassured Education materials provided re: breast changes Orders Placed This Encounter  Procedures  . MM Digital Screening    Standing Status: Future     Number of Occurrences:      Standing Expiration Date: 06/26/2015    Order Specific Question:  Reason for Exam (SYMPTOM  OR DIAGNOSIS REQUIRED)    Answer:  Routine Screening    Order Specific Question:  Is the patient pregnant?    Answer:  No    Order Specific Question:  Preferred imaging location?    Answer:  [facility]  . Ambulatory referral to ENT    Referral Priority:  Routine    Referral Type:  Consultation    Referral Reason:  Specialty Services Required    Requested Specialty:  Otolaryngology    Number of Visits Requested:  1  . Ambulatory referral to Gastroenterology    Referral Priority:  Routine    Referral Type:  Consultation    Referral Reason:  Specialty Services Required    Requested Specialty:  Gastroenterology    Number of Visits Requested:  1     Follow up as needed.      JACKSON-MOORE,Dorethea Strubel A 04/26/2014, 1:17 PM

## 2014-04-26 NOTE — Patient Instructions (Signed)
Mammography Mammography is an X-ray of the breasts to look for changes that are not normal. The X-ray image is called a mammogram. This procedure can screen for breast cancer, can detect cancer early, and can diagnose cancer.  LET YOUR CAREGIVER KNOW ABOUT:  Breast implants.  Previous breast disease, biopsy, or surgery.  If you are breastfeeding.  Medicines taken, including vitamins, herbs, eyedrops, over-the-counter medicines, and creams.  Use of steroids (by mouth or creams).  Possibility of pregnancy, if this applies. RISKS AND COMPLICATIONS  Exposure to radiation, but at very low levels.  The results may be misinterpreted.  The results may not be accurate.  Mammography may lead to further tests.  Mammography may not catch certain cancers. BEFORE THE PROCEDURE  Schedule your test about 7 days after your menstrual period. This is when your breasts are the least tender and have signs of hormone changes.  If you have had a mammography done at a different facility in the past, get the mammogram X-rays or have them sent to your current exam facility in order to compare them.  Wash your breasts and under your arms the day of the test.  Do not wear deodorants, perfumes, or powders anywhere on your body.  Wear clothes that you can change in and out of easily. PROCEDURE Relax as much as possible during the test. Any discomfort during the test will be very brief. The test should take less than 30 minutes. The following will happen:  You will undress from the waist up and put on a gown.  You will stand in front of the X-ray machine.  Each breast will be placed between 2 plastic or glass plates. The plates will compress your breast for a few seconds.  X-rays will be taken from different angles of the breast. AFTER THE PROCEDURE  The mammogram will be examined.  Depending on the quality of the images, you may need to repeat certain parts of the test.  Ask when your test  results will be ready. Make sure you get your test results.  You may resume normal activities. Document Released: 07/09/2000 Document Revised: 10/04/2011 Document Reviewed: 05/02/2011 [employer] Patient Information 2015 [employer]. This information is not intended to replace advice given to you by your health care provider. Make sure you discuss any questions you have with your health care provider.  

## 2014-04-26 NOTE — Telephone Encounter (Signed)
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

## 2014-04-29 ENCOUNTER — Other Ambulatory Visit: Payer: Self-pay | Admitting: Obstetrics & Gynecology

## 2014-04-29 ENCOUNTER — Other Ambulatory Visit: Payer: Self-pay

## 2014-04-29 DIAGNOSIS — N6452 Nipple discharge: Secondary | ICD-10-CM

## 2014-05-01 ENCOUNTER — Other Ambulatory Visit: Payer: Self-pay | Admitting: *Deleted

## 2014-05-03 ENCOUNTER — Other Ambulatory Visit: Payer: Medicaid Other

## 2014-06-05 ENCOUNTER — Ambulatory Visit
Admission: RE | Admit: 2014-06-05 | Discharge: 2014-06-05 | Disposition: A | Discharge status: Home / Self Care | Payer: Medicaid Other | Source: Ambulatory Visit | Attending: Obstetrics & Gynecology | Admitting: Obstetrics & Gynecology

## 2014-06-05 DIAGNOSIS — N6452 Nipple discharge: Secondary | ICD-10-CM

## 2014-07-22 ENCOUNTER — Encounter: Payer: Self-pay | Admitting: *Deleted

## 2014-07-23 ENCOUNTER — Encounter: Payer: Self-pay | Admitting: Obstetrics & Gynecology

## 2014-08-08 ENCOUNTER — Emergency Department (HOSPITAL_COMMUNITY): Payer: Medicaid Other

## 2014-08-08 ENCOUNTER — Encounter (HOSPITAL_COMMUNITY): Payer: Self-pay | Admitting: *Deleted

## 2014-08-08 ENCOUNTER — Emergency Department (HOSPITAL_COMMUNITY)
Admission: EM | Admit: 2014-08-08 | Discharge: 2014-08-09 | Disposition: A | Discharge status: Home / Self Care | Payer: Medicaid Other | Attending: Emergency Medicine | Admitting: Emergency Medicine

## 2014-08-08 DIAGNOSIS — Z8739 Personal history of other diseases of the musculoskeletal system and connective tissue: Secondary | ICD-10-CM | POA: Diagnosis not present

## 2014-08-08 DIAGNOSIS — Z7951 Long term (current) use of inhaled steroids: Secondary | ICD-10-CM | POA: Insufficient documentation

## 2014-08-08 DIAGNOSIS — R197 Diarrhea, unspecified: Secondary | ICD-10-CM

## 2014-08-08 DIAGNOSIS — D72829 Elevated white blood cell count, unspecified: Secondary | ICD-10-CM | POA: Insufficient documentation

## 2014-08-08 DIAGNOSIS — Z3202 Encounter for pregnancy test, result negative: Secondary | ICD-10-CM | POA: Insufficient documentation

## 2014-08-08 DIAGNOSIS — J45909 Unspecified asthma, uncomplicated: Secondary | ICD-10-CM | POA: Diagnosis not present

## 2014-08-08 DIAGNOSIS — R1110 Vomiting, unspecified: Secondary | ICD-10-CM

## 2014-08-08 DIAGNOSIS — Z79899 Other long term (current) drug therapy: Secondary | ICD-10-CM | POA: Insufficient documentation

## 2014-08-08 DIAGNOSIS — R112 Nausea with vomiting, unspecified: Secondary | ICD-10-CM | POA: Diagnosis present

## 2014-08-08 DIAGNOSIS — K529 Noninfective gastroenteritis and colitis, unspecified: Secondary | ICD-10-CM | POA: Insufficient documentation

## 2014-08-08 DIAGNOSIS — R10817 Generalized abdominal tenderness: Secondary | ICD-10-CM

## 2014-08-08 HISTORY — DX: Unspecified asthma, uncomplicated: J45.909

## 2014-08-08 LAB — COMPREHENSIVE METABOLIC PANEL
ALK PHOS: 66 U/L (ref 39–117)
ALT: 15 U/L (ref 0–35)
ANION GAP: 12 (ref 5–15)
AST: 39 U/L — AB (ref 0–37)
Albumin: 4.1 g/dL (ref 3.5–5.2)
BUN: 15 mg/dL (ref 6–23)
CO2: 20 mmol/L (ref 19–32)
Calcium: 9.6 mg/dL (ref 8.4–10.5)
Chloride: 106 mEq/L (ref 96–112)
Creatinine, Ser: 1.06 mg/dL (ref 0.50–1.10)
GFR, EST AFRICAN AMERICAN: 73 mL/min — AB (ref >90)
GFR, EST NON AFRICAN AMERICAN: 63 mL/min — AB (ref >90)
Glucose, Bld: 84 mg/dL (ref 70–99)
Potassium: 4.2 mmol/L (ref 3.5–5.1)
Sodium: 138 mmol/L (ref 135–145)
TOTAL PROTEIN: 8.3 g/dL (ref 6.0–8.3)
Total Bilirubin: 0.6 mg/dL (ref 0.3–1.2)

## 2014-08-08 LAB — CBC WITH DIFFERENTIAL/PLATELET
BASOS PCT: 0 % (ref 0–1)
Basophils Absolute: 0 10*3/uL (ref 0.0–0.1)
Eosinophils Absolute: 0.1 10*3/uL (ref 0.0–0.7)
Eosinophils Relative: 1 % (ref 0–5)
HCT: 41.6 % (ref 36.0–46.0)
Hemoglobin: 13.8 g/dL (ref 12.0–15.0)
LYMPHS ABS: 2.2 10*3/uL (ref 0.7–4.0)
LYMPHS PCT: 15 % (ref 12–46)
MCH: 28.3 pg (ref 26.0–34.0)
MCHC: 33.2 g/dL (ref 30.0–36.0)
MCV: 85.4 fL (ref 78.0–100.0)
MONOS PCT: 6 % (ref 3–12)
Monocytes Absolute: 0.8 10*3/uL (ref 0.1–1.0)
Neutro Abs: 11.7 10*3/uL — ABNORMAL HIGH (ref 1.7–7.7)
Neutrophils Relative %: 78 % — ABNORMAL HIGH (ref 43–77)
PLATELETS: 350 10*3/uL (ref 150–400)
RBC: 4.87 MIL/uL (ref 3.87–5.11)
RDW: 15.5 % (ref 11.5–15.5)
WBC: 14.8 10*3/uL — AB (ref 4.0–10.5)

## 2014-08-08 LAB — URINALYSIS, ROUTINE W REFLEX MICROSCOPIC
Bilirubin Urine: NEGATIVE
Glucose, UA: NEGATIVE mg/dL
KETONES UR: NEGATIVE mg/dL
Leukocytes, UA: NEGATIVE
Nitrite: NEGATIVE
PROTEIN: NEGATIVE mg/dL
SPECIFIC GRAVITY, URINE: 1.024 (ref 1.005–1.030)
UROBILINOGEN UA: 1 mg/dL (ref 0.0–1.0)
pH: 6.5 (ref 5.0–8.0)

## 2014-08-08 LAB — URINE MICROSCOPIC-ADD ON

## 2014-08-08 LAB — LIPASE, BLOOD: LIPASE: 57 U/L (ref 11–59)

## 2014-08-08 LAB — PREGNANCY, URINE: Preg Test, Ur: NEGATIVE

## 2014-08-08 MED ORDER — SODIUM CHLORIDE 0.9 % IV BOLUS (SEPSIS)
1000.0000 mL | Freq: Once | INTRAVENOUS | Status: AC
  Administered 2014-08-08: 1000 mL via INTRAVENOUS

## 2014-08-08 MED ORDER — ONDANSETRON HCL 4 MG/2ML IJ SOLN
4.0000 mg | Freq: Once | INTRAMUSCULAR | Status: AC
  Administered 2014-08-08: 4 mg via INTRAVENOUS
  Filled 2014-08-08: qty 2

## 2014-08-08 MED ORDER — IOHEXOL 300 MG/ML  SOLN
25.0000 mL | INTRAMUSCULAR | Status: AC
  Administered 2014-08-08: 25 mL via ORAL

## 2014-08-08 MED ORDER — MORPHINE SULFATE 4 MG/ML IJ SOLN
4.0000 mg | Freq: Once | INTRAMUSCULAR | Status: AC
Start: 2014-08-08 — End: 2014-08-08
  Administered 2014-08-08: 4 mg via INTRAVENOUS
  Filled 2014-08-08: qty 1

## 2014-08-08 NOTE — ED Notes (Signed)
Transporter came for x-ray, urine sample not resulted yet.

## 2014-08-08 NOTE — ED Notes (Signed)
Pt to ED via [facility] c/o nausea and emesis x 4 episodes over the past 2 hours ago. Pt also c/o diarrhea for 3 months; pt scheduled for endo/colonoscopy on the 20th. Hx of GERD. Reports eating leftovers at 2pm

## 2014-08-08 NOTE — ED Notes (Signed)
Called CT to report patient has finished contrast.

## 2014-08-08 NOTE — ED Provider Notes (Signed)
CSN: [id number]     Arrival date & time 08/08/14  1847 History   First MD Initiated Contact with Patient 08/08/14 1848     Chief Complaint  Patient presents with  . Nausea  . Emesis  . Diarrhea     (Consider location/radiation/quality/duration/timing/severity/associated sxs/prior Treatment) HPI  Alisha Torres is a 44 y.o. female with PMH of herniated disc presenting with nausea, vomiting for the past 2 hours. Patient states she has had 4 episodes of emesis that are nonbloody nonbilious. Patient also reports diarrhea that is nonbloody for 3 months. She was referred by her primary care provider to GI and is scheduled for endoscopy as well as colonoscopy in 6 days. Patient has had unremarkable abdominal ultrasound but no CT. She reported the symptoms started after eating leftovers at 2 AM. She also endorses tactile fevers and chills. Patient has not taken any medications for her symptoms. She denies alleviating or aggravating factors. She states she has had similar diarrhea she is wearing an adult diaper. Patient denies any pelvic or urinary symptoms. Patient with right oophorectomy 20 years ago per patient. She denies sick contacts.   Past Medical History  Diagnosis Date  . Sciatica   . Herniated disc   . Asthma    Past Surgical History  Procedure Laterality Date  . Abdominal surgery    . Right oophorectomy     Family History  Problem Relation Age of Onset  . Cancer Mother     liver  . Anesthesia problems Father   . Heart disease Maternal Grandmother   . Seizures Paternal Grandfather    History  Substance Use Topics  . Smoking status: Never Smoker   . Smokeless tobacco: Not on file  . Alcohol Use: No   OB History    No data available     Review of Systems 10 Systems reviewed and are negative for acute change except as noted in the HPI.    Allergies  Review of patient's allergies indicates no known allergies.  Home Medications   Prior to Admission medications    Medication Sig Start Date End Date Taking? Authorizing Provider  fluticasone (FLONASE) 50 MCG/ACT nasal spray Place 2 sprays into both nostrils daily. 02/08/14  Yes Nicole Pisciotta, PA-C  omeprazole (PRILOSEC) 20 MG capsule Take 20 mg by mouth 2 (two) times daily before a meal.   Yes Historical Provider, MD   BP 135/96 mmHg  Pulse 65  Temp(Src) 97.8 F (36.6 C) (Oral)  Resp 18  SpO2 100%  LMP 07/20/2014 Physical Exam  Constitutional: She appears well-developed and well-nourished. No distress.  HENT:  Head: Normocephalic and atraumatic.  Mouth/Throat: Oropharynx is clear and moist.  Eyes: Conjunctivae and EOM are normal. Right eye exhibits no discharge. Left eye exhibits no discharge.  Cardiovascular: Normal rate, regular rhythm and normal heart sounds.   Pulmonary/Chest: Effort normal and breath sounds normal. No respiratory distress. She has no wheezes.  Abdominal: Soft. She exhibits no distension.  Diffuse abdominal tenderness worse and epigastric and left lower quadrant with hyperactive bowel sounds. No rebound, rigidity, guarding. No CVA tenderness.  Neurological: She is alert. She exhibits normal muscle tone. Coordination normal.  Skin: Skin is warm and dry. She is not diaphoretic.  Nursing note and vitals reviewed.   ED Course  Procedures (including critical care time) Labs Review Labs Reviewed  CBC WITH DIFFERENTIAL - Abnormal; Notable for the following:    WBC 14.8 (*)    Neutrophils Relative % 78 (*)  Neutro Abs 11.7 (*)    All other components within normal limits  COMPREHENSIVE METABOLIC PANEL - Abnormal; Notable for the following:    AST 39 (*)    GFR calc non Af Amer 63 (*)    GFR calc Af Amer 73 (*)    All other components within normal limits  URINALYSIS, ROUTINE W REFLEX MICROSCOPIC - Abnormal; Notable for the following:    Hgb urine dipstick MODERATE (*)    All other components within normal limits  URINE MICROSCOPIC-ADD ON - Abnormal; Notable for the  following:    Squamous Epithelial / LPF FEW (*)    All other components within normal limits  LIPASE, BLOOD  PREGNANCY, URINE    Imaging Review Dg Abd Acute W/chest  08/08/2014   CLINICAL DATA:  Vomiting.  Abdominal pain.  EXAM: ACUTE ABDOMEN SERIES (ABDOMEN 2 VIEW & CHEST 1 VIEW)  COMPARISON:  None.  FINDINGS: The cardiomediastinal contours are normal. The lungs are clear. There is no free intra-abdominal air. No dilated bowel loops to suggest obstruction. There is an air-fluid level in the stomach and smaller fluid levels in normal caliber bowel loops in the right abdomen. Small volume of stool throughout the colon. No radiopaque calculi. Bilateral pelvic phleboliths are seen. No acute osseous abnormalities are seen.  IMPRESSION: Nonobstructive bowel gas pattern. Scattered air-fluid levels, may reflect enteritis.   Electronically Signed   By: Rubye Oaks M.D.   On: 08/08/2014 21:49     EKG Interpretation None      MDM   Final diagnoses:  Emesis  Generalized abdominal tenderness  Diarrhea  Leukocytosis   Pt with 3 month history of diarrhea followed by GI without diagnosis presenting with tactile fevers, generalized abdominal pain and 4 episodes of emesis. VSS. Diffuse abdominal tenderness worse in left lower quadrant and epigastric. Patient without vomiting or diarrhea in ED. Pt with leukocytosis. No electrolyte abnormalities. Chest x-ray with scattered air-fluid levels. Patient with history of abdominal surgeries. We'll proceed with CT abdomen and pelvis. Concern for ileitis either infectious or inflammatory. Patient with follow-up with GI in 6 days. Patient given prescription for Norco for severe pain. Driving and sedation precautions provided. Patient with home Zofran. Patient given strict return precautions.  Discussed return precautions with patient. Discussed all results and patient verbalizes understanding and agrees with plan.  Case has been discussed with Dr. Littie Deeds who  agrees with the above plan and to discharge.    Louann Sjogren, PA-C 08/09/14 0140  Mirian Mo, MD 08/09/14 [phone]

## 2014-08-08 NOTE — ED Notes (Signed)
Turkey[place]ia, pa, at the bedside.

## 2014-08-08 NOTE — ED Notes (Signed)
Encouraged patient to void, radiology testing will be completed after urine sample has been submitted.

## 2014-08-09 ENCOUNTER — Encounter (HOSPITAL_COMMUNITY): Payer: Self-pay | Admitting: Radiology

## 2014-08-09 MED ORDER — IOHEXOL 300 MG/ML  SOLN
100.0000 mL | Freq: Once | INTRAMUSCULAR | Status: AC | PRN
  Administered 2014-08-09: 100 mL via INTRAVENOUS

## 2014-08-09 MED ORDER — HYDROCODONE-ACETAMINOPHEN 5-325 MG PO TABS
2.0000 | ORAL_TABLET | Freq: Once | ORAL | Status: AC
  Administered 2014-08-09: 2 via ORAL
  Filled 2014-08-09: qty 2

## 2014-08-09 MED ORDER — HYDROCODONE-ACETAMINOPHEN 5-325 MG PO TABS: 1.0000 | ORAL_TABLET | ORAL | Status: DC | PRN

## 2014-08-09 NOTE — Discharge Instructions (Signed)
Return to the emergency room with worsening of symptoms, new symptoms or with symptoms that are concerning, especially fevers, blood in stool or vomiting, unable to keep down fluids, you feel faint or pass out, severe pain. Home zofran for nausea. norco for severe pain. Do not operate machinery, drive or drink alcohol while taking narcotics or muscle relaxers. Call made an appointment with GI for follow-up. He may follow-up with her GI physician as well. Read below information and follow recommendations.  Colitis Colitis is inflammation of the colon. Colitis can be a short-term or long-standing (chronic) illness. Crohn's disease and ulcerative colitis are 2 types of colitis which are chronic. They usually require lifelong treatment. CAUSES  There are many different causes of colitis, including:  Viruses.  Germs (bacteria).  Medicine reactions. SYMPTOMS   Diarrhea.  Intestinal bleeding.  Pain.  Fever.  Throwing up (vomiting).  Tiredness (fatigue).  Weight loss.  Bowel blockage. DIAGNOSIS  The diagnosis of colitis is based on examination and stool or blood tests. X-rays, CT scan, and colonoscopy may also be needed. TREATMENT  Treatment may include:  Fluids given through the vein (intravenously).  Bowel rest (nothing to eat or drink for a period of time).  Medicine for pain and diarrhea.  Medicines (antibiotics) that kill germs.  Cortisone medicines.  Surgery. HOME CARE INSTRUCTIONS   Get plenty of rest.  Drink enough water and fluids to keep your urine clear or pale yellow.  Eat a well-balanced diet.  Call your caregiver for follow-up as recommended. SEEK IMMEDIATE MEDICAL CARE IF:   You develop chills.  You have an oral temperature above 102 F (38.9 C), not controlled by medicine.  You have extreme weakness, fainting, or dehydration.  You have repeated vomiting.  You develop severe belly (abdominal) pain or are passing bloody or tarry stools. MAKE  SURE YOU:   Understand these instructions.  Will watch your condition.  Will get help right away if you are not doing well or get worse. Document Released: 08/19/2004 Document Revised: 10/04/2011 Document Reviewed: 11/14/2009 [facility] Patient Information 2015 [place], [place]. This information is not intended to replace advice given to you by your health care provider. Make sure you discuss any questions you have with your health care provider.

## 2015-01-02 ENCOUNTER — Ambulatory Visit (INDEPENDENT_AMBULATORY_CARE_PROVIDER_SITE_OTHER): Payer: Medicaid Other | Admitting: Obstetrics

## 2015-01-02 ENCOUNTER — Encounter: Payer: Self-pay | Admitting: Obstetrics

## 2015-01-02 VITALS — BP 143/92 | HR 69 | Wt 193.0 lb

## 2015-01-02 DIAGNOSIS — N939 Abnormal uterine and vaginal bleeding, unspecified: Secondary | ICD-10-CM

## 2015-01-02 DIAGNOSIS — R399 Unspecified symptoms and signs involving the genitourinary system: Secondary | ICD-10-CM | POA: Diagnosis not present

## 2015-01-02 DIAGNOSIS — Z3169 Encounter for other general counseling and advice on procreation: Secondary | ICD-10-CM | POA: Diagnosis not present

## 2015-01-02 LAB — POCT URINALYSIS DIPSTICK
Bilirubin, UA: NEGATIVE
Blood, UA: 250
GLUCOSE UA: NEGATIVE
Ketones, UA: NEGATIVE
Nitrite, UA: POSITIVE
PH UA: 5
Protein, UA: NEGATIVE
SPEC GRAV UA: 1.02
UROBILINOGEN UA: NEGATIVE

## 2015-01-02 MED ORDER — CONCEPT OB 130-92.4-1 MG PO CAPS: 1.0000 | ORAL_CAPSULE | Freq: Every day | ORAL | Status: AC

## 2015-01-02 MED ORDER — NITROFURANTOIN MONOHYD MACRO 100 MG PO CAPS: 100.0000 mg | ORAL_CAPSULE | Freq: Two times a day (BID) | ORAL | Status: DC

## 2015-01-02 NOTE — Progress Notes (Signed)
Patient ID: Alisha Torres, female   DOB: 1971-07-14, 44 y.o.   MRN: [id number]  Chief Complaint  Patient presents with  . Gynecologic Exam    HPI Alisha Torres is a 44 y.o. female.  Presents with c/o bleeding after intercourse.  Trying to get pregnant for > 1 year without success.  Also c/o urinary frequency and burning with urination. HPI  Past Medical History  Diagnosis Date  . Sciatica   . Herniated disc   . Asthma     Past Surgical History  Procedure Laterality Date  . Abdominal surgery    . Right oophorectomy      Family History  Problem Relation Age of Onset  . Cancer Mother     liver  . Anesthesia problems Father   . Heart disease Maternal Grandmother   . Seizures Paternal Grandfather     Social History History  Substance Use Topics  . Smoking status: Never Smoker   . Smokeless tobacco: Not on file  . Alcohol Use: No    No Known Allergies  Current Outpatient Prescriptions  Medication Sig Dispense Refill  . fluticasone (FLONASE) 50 MCG/ACT nasal spray Place 2 sprays into both nostrils daily. (Patient not taking: Reported on 01/02/2015) 16 g 0  . HYDROcodone-acetaminophen (NORCO/VICODIN) 5-325 MG per tablet Take 1-2 tablets by mouth every 4 (four) hours as needed for moderate pain or severe pain. (Patient not taking: Reported on 01/02/2015) 10 tablet 0  . nitrofurantoin, macrocrystal-monohydrate, (MACROBID) 100 MG capsule Take 1 capsule (100 mg total) by mouth 2 (two) times daily. 1 po BID x 7days 14 capsule 2  . omeprazole (PRILOSEC) 20 MG capsule Take 20 mg by mouth 2 (two) times daily before a meal.    . Prenat w/o A Vit-FeFum-FePo-FA (CONCEPT OB) 130-92.4-1 MG CAPS Take 1 capsule by mouth daily. 30 capsule 11   No current facility-administered medications for this visit.    Review of Systems Review of Systems Constitutional: negative for fatigue and weight loss Respiratory: negative for cough and wheezing Cardiovascular: negative for chest pain,  fatigue and palpitations Gastrointestinal: negative for abdominal pain and change in bowel habits Genitourinary:negative Integument/breast: negative for nipple discharge Musculoskeletal:negative for myalgias Neurological: negative for gait problems and tremors Behavioral/Psych: negative for abusive relationship, depression Endocrine: negative for temperature intolerance     Blood pressure 143/92, pulse 69, weight 193 lb (87.544 kg), last menstrual period 12/16/2014.  Physical Exam Physical Exam Abdomen:  normal findings: no organomegaly, soft, non-tender and no hernia  Pelvis:  External genitalia: normal general appearance Urinary system: urethral meatus normal and bladder without fullness, nontender Vaginal: normal without tenderness, induration or masses Cervix: normal appearance Adnexa: normal bimanual exam Uterus: anteverted and non-tender, normal size    Data Reviewed Labs  Assessment     AUB UTI Preconception Counseling     Plan    Ultrasound ordered Macrobid Rx F/U in 2 weeks for results   Orders Placed This Encounter  Procedures  . SureSwab, Vaginosis/Vaginitis Plus  . Urine Culture  . US Transvaginal Non-OB    Standing Status: Future     Number of Occurrences:      Standing Expiration Date: 03/03/2016    Order Specific Question:  Reason for Exam (SYMPTOM  OR DIAGNOSIS REQUIRED)    Answer:  AUB    Order Specific Question:  Preferred imaging location?    Answer:  Internal  . POCT urinalysis dipstick   Meds ordered this encounter  Medications  . nitrofurantoin, macrocrystal-monohydrate, (MACROBID) 100  MG capsule    Sig: Take 1 capsule (100 mg total) by mouth 2 (two) times daily. 1 po BID x 7days    Dispense:  14 capsule    Refill:  2  . Prenat w/o A Vit-FeFum-FePo-FA (CONCEPT OB) 130-92.4-1 MG CAPS    Sig: Take 1 capsule by mouth daily.    Dispense:  30 capsule    Refill:  11

## 2015-01-05 ENCOUNTER — Other Ambulatory Visit: Payer: Self-pay | Admitting: Obstetrics

## 2015-01-05 LAB — URINE CULTURE

## 2015-01-06 LAB — SURESWAB, VAGINOSIS/VAGINITIS PLUS
ATOPOBIUM VAGINAE: NOT DETECTED Log (cells/mL)
C. TRACHOMATIS RNA, TMA: NOT DETECTED
C. TROPICALIS, DNA: NOT DETECTED
C. albicans, DNA: NOT DETECTED
C. glabrata, DNA: NOT DETECTED
C. parapsilosis, DNA: NOT DETECTED
Gardnerella vaginalis: 5.9 Log (cells/mL)
LACTOBACILLUS SPECIES: NOT DETECTED Log (cells/mL)
MEGASPHAERA SPECIES: NOT DETECTED Log (cells/mL)
N. GONORRHOEAE RNA, TMA: NOT DETECTED
T. VAGINALIS RNA, QL TMA: NOT DETECTED

## 2015-01-17 ENCOUNTER — Ambulatory Visit (INDEPENDENT_AMBULATORY_CARE_PROVIDER_SITE_OTHER): Payer: Medicaid Other

## 2015-01-17 ENCOUNTER — Other Ambulatory Visit: Payer: Self-pay | Admitting: Obstetrics

## 2015-01-17 DIAGNOSIS — N939 Abnormal uterine and vaginal bleeding, unspecified: Secondary | ICD-10-CM | POA: Diagnosis not present

## 2015-01-28 ENCOUNTER — Telehealth: Payer: Self-pay | Admitting: *Deleted

## 2015-01-28 NOTE — Telephone Encounter (Signed)
Patient  Calling for results of test.  11:45 Call to patient reviewed results in chart- partner has appointment for semen analysis. Reviewed timing of intercourse and consult appointment made to discuss fertility options with Dr Clearance CootsHarper.

## 2015-02-19 ENCOUNTER — Encounter: Payer: Self-pay | Admitting: Obstetrics

## 2015-02-19 ENCOUNTER — Ambulatory Visit (INDEPENDENT_AMBULATORY_CARE_PROVIDER_SITE_OTHER): Payer: Medicaid Other | Admitting: Obstetrics

## 2015-02-19 VITALS — BP 128/87 | HR 62 | Temp 98.4°F | Ht 65.5 in | Wt 186.0 lb

## 2015-02-19 DIAGNOSIS — Z3169 Encounter for other general counseling and advice on procreation: Secondary | ICD-10-CM

## 2015-02-19 NOTE — Progress Notes (Signed)
Patient ID: Alisha Torres, female   DOB: 12-03-1970, 44 y.o.   MRN: [id number]  Chief Complaint  Patient presents with  . Advice Only    HPI Alisha Torres is a 44 y.o. female.  Wants to conceive, and has not been able to conceive with new partner.  She has children from previous partner.  He has a son from his previous partner. HPI  Past Medical History  Diagnosis Date  . Sciatica   . Herniated disc   . Asthma     Past Surgical History  Procedure Laterality Date  . Abdominal surgery    . Right oophorectomy      Family History  Problem Relation Age of Onset  . Cancer Mother     liver  . Anesthesia problems Father   . Heart disease Maternal Grandmother   . Seizures Paternal Grandfather     Social History History  Substance Use Topics  . Smoking status: Never Smoker   . Smokeless tobacco: Not on file  . Alcohol Use: No    No Known Allergies  Current Outpatient Prescriptions  Medication Sig Dispense Refill  . Prenat w/o A Vit-FeFum-FePo-FA (CONCEPT OB) 130-92.4-1 MG CAPS Take 1 capsule by mouth daily. 30 capsule 11   No current facility-administered medications for this visit.    Review of Systems Review of Systems Constitutional: negative for fatigue and weight loss Respiratory: negative for cough and wheezing Cardiovascular: negative for chest pain, fatigue and palpitations Gastrointestinal: negative for abdominal pain and change in bowel habits Genitourinary:negative Integument/breast: negative for nipple discharge Musculoskeletal:negative for myalgias Neurological: negative for gait problems and tremors Behavioral/Psych: negative for abusive relationship, depression Endocrine: negative for temperature intolerance     Blood pressure 128/87, pulse 62, temperature 98.4 F (36.9 C), height 5' 5.5" (1.664 m), weight 186 lb (84.369 kg), last menstrual period 02/15/2015.  Physical Exam Physical Exam:   Deferred                                                     100% of 10 min visit spent on counseling and coordination of care.   Data Reviewed Labs  Assessment                            Preconception Consultation AMA     Plan    Continue prenatal vitamins Return for day 21 progesterone Partner is suppose to be getting a semen analysis   Orders Placed This Encounter  Procedures  . Progesterone    Standing Status: Future     Number of Occurrences:      Standing Expiration Date: 02/19/2016   No orders of the defined types were placed in this encounter.

## 2015-03-06 ENCOUNTER — Other Ambulatory Visit: Payer: Medicaid Other

## 2015-03-06 DIAGNOSIS — Z3169 Encounter for other general counseling and advice on procreation: Secondary | ICD-10-CM

## 2015-03-07 LAB — PROGESTERONE: PROGESTERONE: 10.3 ng/mL

## 2015-03-27 ENCOUNTER — Telehealth: Payer: Self-pay | Admitting: *Deleted

## 2015-03-27 NOTE — Telephone Encounter (Signed)
Patient wants to know her ovulation results.

## 2015-04-18 ENCOUNTER — Encounter: Payer: Self-pay | Admitting: Obstetrics

## 2015-04-18 ENCOUNTER — Ambulatory Visit (INDEPENDENT_AMBULATORY_CARE_PROVIDER_SITE_OTHER): Payer: Medicaid Other | Admitting: Obstetrics

## 2015-04-18 VITALS — BP 135/90 | HR 77 | Temp 98.4°F | Ht 65.0 in | Wt 181.0 lb

## 2015-04-18 DIAGNOSIS — N970 Female infertility associated with anovulation: Secondary | ICD-10-CM

## 2015-04-18 DIAGNOSIS — Z3169 Encounter for other general counseling and advice on procreation: Secondary | ICD-10-CM

## 2015-04-18 MED ORDER — CLOMIPHENE CITRATE 50 MG PO TABS: ORAL_TABLET | ORAL | Status: AC

## 2015-04-18 NOTE — Progress Notes (Signed)
Patient ID: Alisha Torres, female   DOB: 1971/04/09, 44 y.o.   MRN: [id number]  Chief Complaint  Patient presents with  . Follow-up    HPI Alisha Torres is a 44 y.o. female.  Patient has been trying to get pregnant for past year without success.  She has a day 21 progesterone of 10.  HPI  Past Medical History  Diagnosis Date  . Sciatica   . Herniated disc   . Asthma     Past Surgical History  Procedure Laterality Date  . Abdominal surgery    . Right oophorectomy      Family History  Problem Relation Age of Onset  . Cancer Mother     liver  . Anesthesia problems Father   . Heart disease Maternal Grandmother   . Seizures Paternal Grandfather     Social History Social History  Substance Use Topics  . Smoking status: Never Smoker   . Smokeless tobacco: None  . Alcohol Use: No    No Known Allergies  Current Outpatient Prescriptions  Medication Sig Dispense Refill  . clomiPHENE (CLOMID) 50 MG tablet Take 1 tablet po daily starting day 5-9 of menstrual cycle. 5 tablet 1  . Prenat w/o A Vit-FeFum-FePo-FA (CONCEPT OB) 130-92.4-1 MG CAPS Take 1 capsule by mouth daily. 30 capsule 11   No current facility-administered medications for this visit.    Review of Systems Review of Systems Constitutional: negative for fatigue and weight loss Respiratory: negative for cough and wheezing Cardiovascular: negative for chest pain, fatigue and palpitations Gastrointestinal: negative for abdominal pain and change in bowel habits Genitourinary:negative Integument/breast: negative for nipple discharge Musculoskeletal:negative for myalgias Neurological: negative for gait problems and tremors Behavioral/Psych: negative for abusive relationship, depression Endocrine: negative for temperature intolerance     Blood pressure 135/90, pulse 77, temperature 98.4 F (36.9 C), height  (1.651 m), weight 181 lb (82.101 kg), last menstrual period 04/12/2015.  Physical Exam Physical  Exam: Deferred  100% of 10 min visit spent on counseling and coordination of care.   Data Reviewed Labs  Assessment     Ovulation dysfunction AMA     Plan    Will try a few courses of Clomid. F/U in 3 months   No orders of the defined types were placed in this encounter.   Meds ordered this encounter  Medications  . clomiPHENE (CLOMID) 50 MG tablet    Sig: Take 1 tablet po daily starting day 5-9 of menstrual cycle.    Dispense:  5 tablet    Refill:  1

## 2015-07-14 ENCOUNTER — Ambulatory Visit: Payer: Medicaid Other | Admitting: Obstetrics

## 2015-09-24 ENCOUNTER — Emergency Department (HOSPITAL_COMMUNITY)
Admission: EM | Admit: 2015-09-24 | Discharge: 2015-09-24 | Disposition: A | Discharge status: Home / Self Care | Payer: No Typology Code available for payment source | Attending: Emergency Medicine | Admitting: Emergency Medicine

## 2015-09-24 ENCOUNTER — Encounter (HOSPITAL_COMMUNITY): Payer: Self-pay | Admitting: *Deleted

## 2015-09-24 DIAGNOSIS — S0990XA Unspecified injury of head, initial encounter: Secondary | ICD-10-CM | POA: Insufficient documentation

## 2015-09-24 DIAGNOSIS — Y998 Other external cause status: Secondary | ICD-10-CM | POA: Insufficient documentation

## 2015-09-24 DIAGNOSIS — R51 Headache: Secondary | ICD-10-CM

## 2015-09-24 DIAGNOSIS — M545 Low back pain, unspecified: Secondary | ICD-10-CM

## 2015-09-24 DIAGNOSIS — Z79899 Other long term (current) drug therapy: Secondary | ICD-10-CM | POA: Diagnosis not present

## 2015-09-24 DIAGNOSIS — J45909 Unspecified asthma, uncomplicated: Secondary | ICD-10-CM | POA: Diagnosis not present

## 2015-09-24 DIAGNOSIS — S3992XA Unspecified injury of lower back, initial encounter: Secondary | ICD-10-CM | POA: Insufficient documentation

## 2015-09-24 DIAGNOSIS — Y9389 Activity, other specified: Secondary | ICD-10-CM | POA: Insufficient documentation

## 2015-09-24 DIAGNOSIS — S199XXA Unspecified injury of neck, initial encounter: Secondary | ICD-10-CM | POA: Diagnosis present

## 2015-09-24 DIAGNOSIS — Y92410 Unspecified street and highway as the place of occurrence of the external cause: Secondary | ICD-10-CM | POA: Diagnosis not present

## 2015-09-24 DIAGNOSIS — R519 Headache, unspecified: Secondary | ICD-10-CM

## 2015-09-24 DIAGNOSIS — M542 Cervicalgia: Secondary | ICD-10-CM

## 2015-09-24 MED ORDER — METHOCARBAMOL 500 MG PO TABS: 500.0000 mg | ORAL_TABLET | Freq: Two times a day (BID) | ORAL | Status: AC

## 2015-09-24 MED ORDER — ACETAMINOPHEN 325 MG PO TABS
650.0000 mg | ORAL_TABLET | Freq: Once | ORAL | Status: AC
  Administered 2015-09-24: 650 mg via ORAL
  Filled 2015-09-24: qty 2

## 2015-09-24 MED ORDER — METHOCARBAMOL 500 MG PO TABS
500.0000 mg | ORAL_TABLET | Freq: Once | ORAL | Status: AC
  Administered 2015-09-24: 500 mg via ORAL
  Filled 2015-09-24: qty 1
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
## 2015-09-24 NOTE — Discharge Instructions (Signed)
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 reduce pain and spasms.  Maintain a healthy weight. Excess weight puts extra stress on your back and makes it difficult to maintain good posture. SEEK MEDICAL CARE IF:  You have pain that is not relieved with rest or medicine.  You have increasing pain going down into the legs or buttocks.  You have pain that does not improve in one week.  You have night pain.  You lose weight.  You have a fever or chills. SEEK IMMEDIATE MEDICAL CARE IF:  You develop new bowel or bladder control problems.  You have unusual weakness or numbness in your arms or legs.  You develop nausea or vomiting.  You develop abdominal pain.  You feel faint.   This information is not intended to replace advice given to you by your health care provider. Make sure you discuss any questions you have with your health care provider.   Document Released: 07/12/2005 Document Revised: 08/02/2014 Document  Reviewed: 11/13/2013 Elsevier Interactive Patient Education [employer]

## 2015-09-24 NOTE — ED Notes (Signed)
Patient able to ambulate independently  

## 2015-10-20 IMAGING — DX DG ABDOMEN ACUTE W/ 1V CHEST
3 series · 3 of 3 positions shown · non-contrast
Comparison: None.

CLINICAL DATA: Vomiting.  Abdominal pain.

EXAM:
ACUTE ABDOMEN SERIES (ABDOMEN 2 VIEW & CHEST 1 VIEW)

[chest pa]
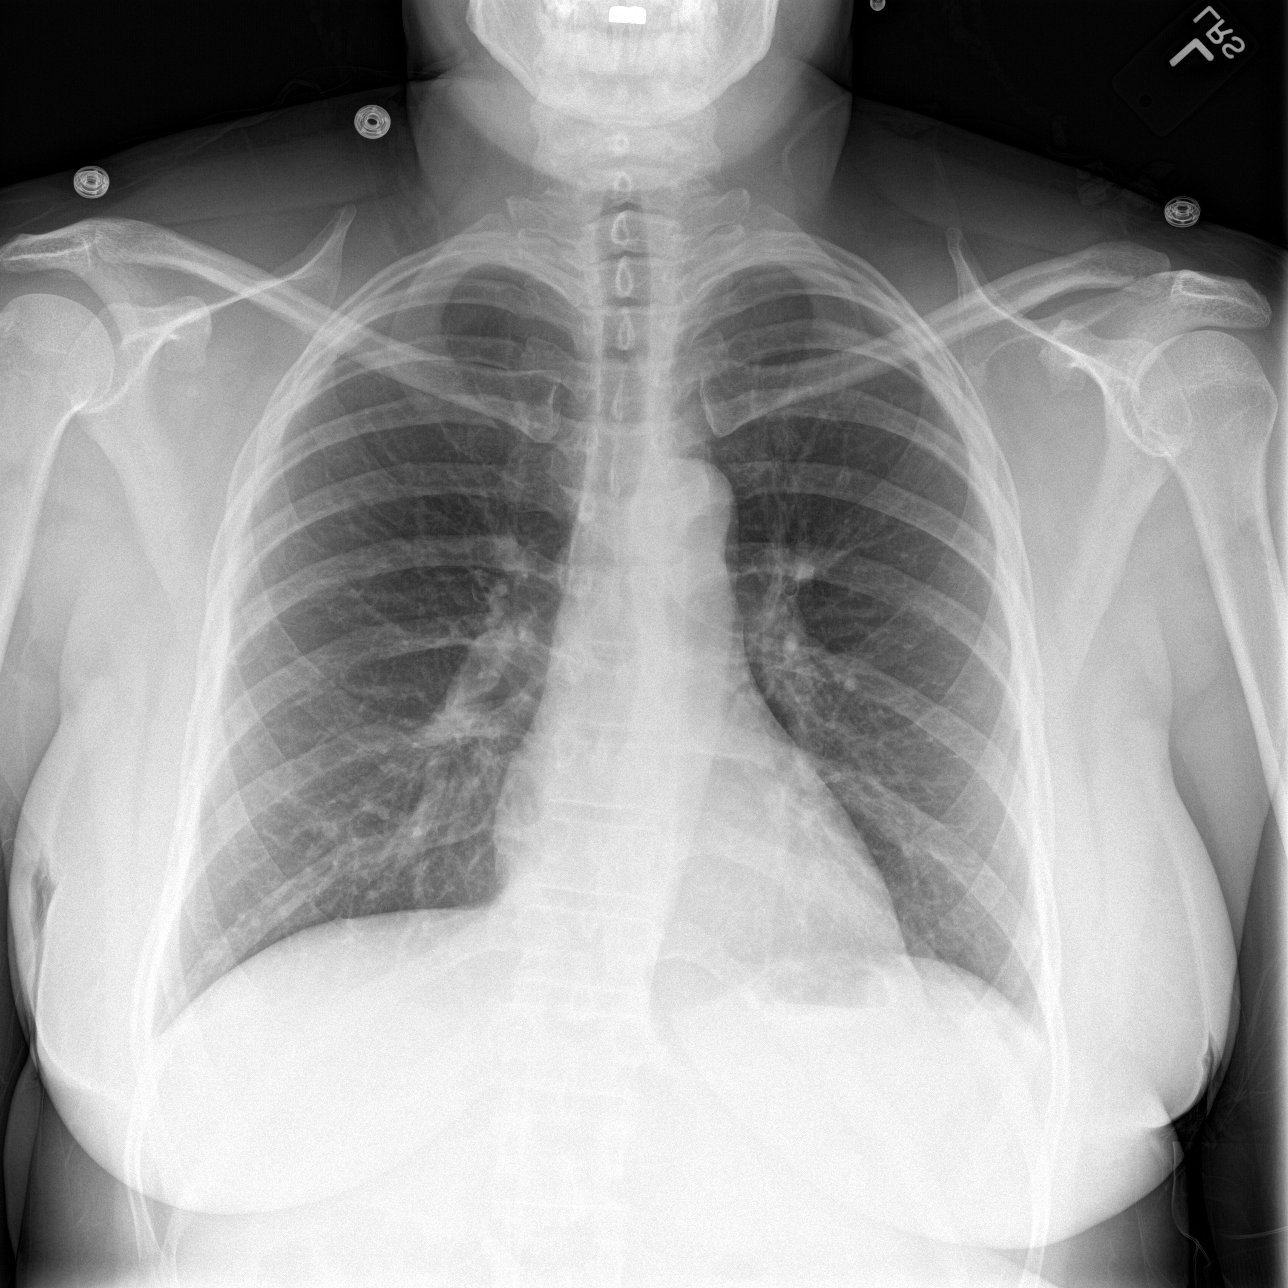

[abdomen erect]
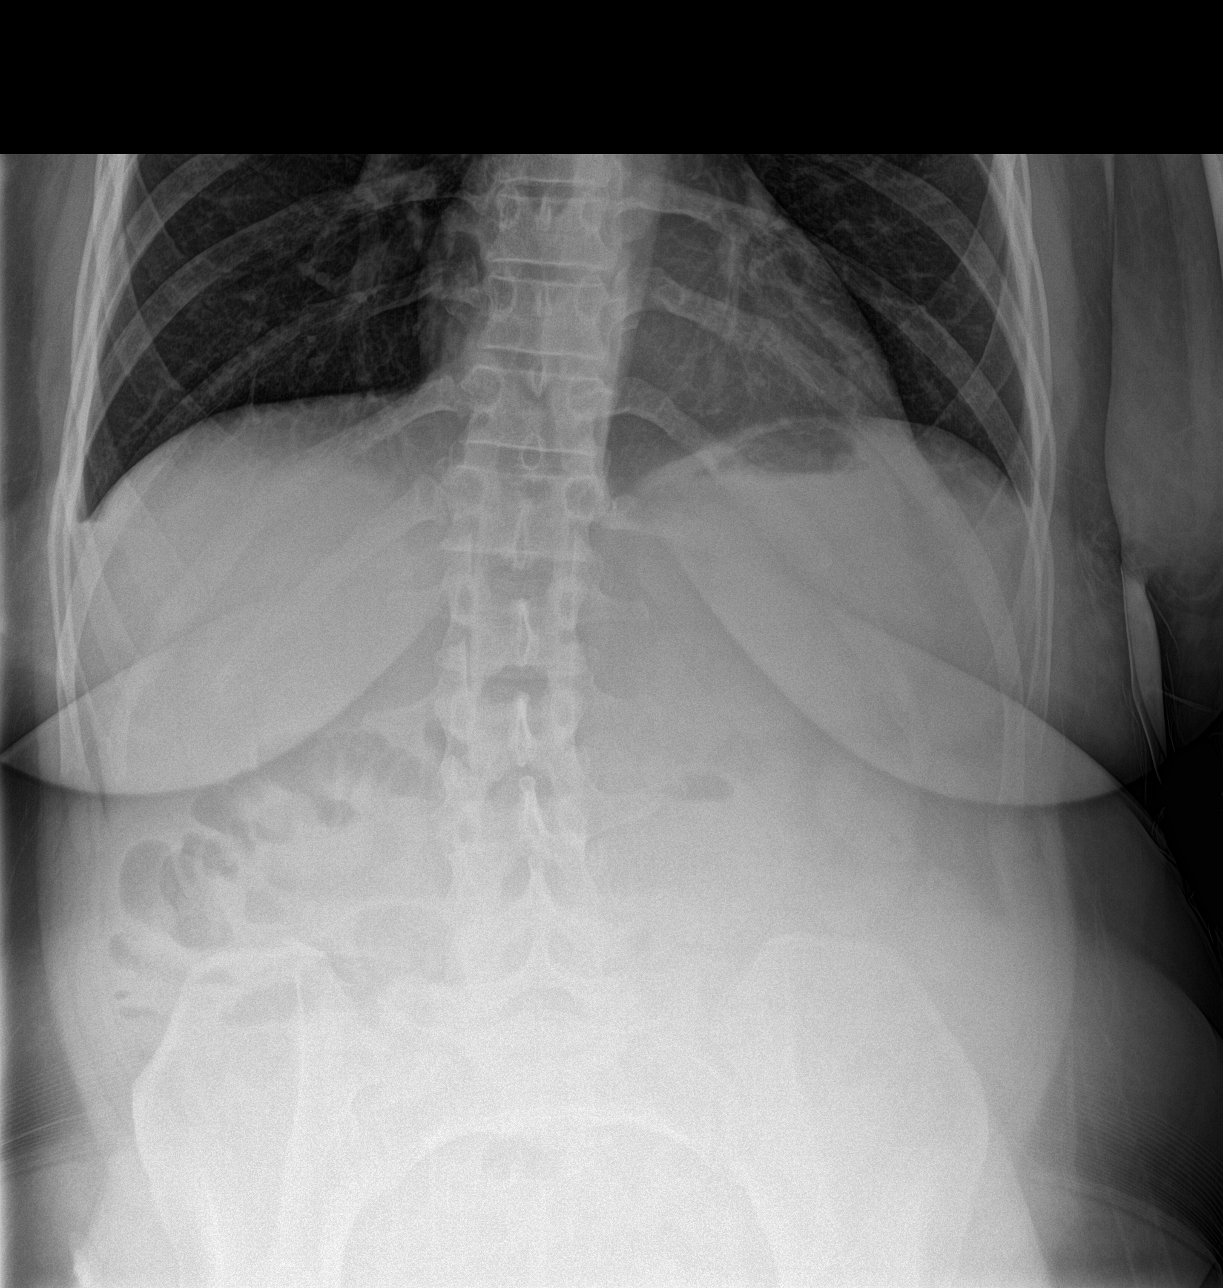

[abdomen supine]
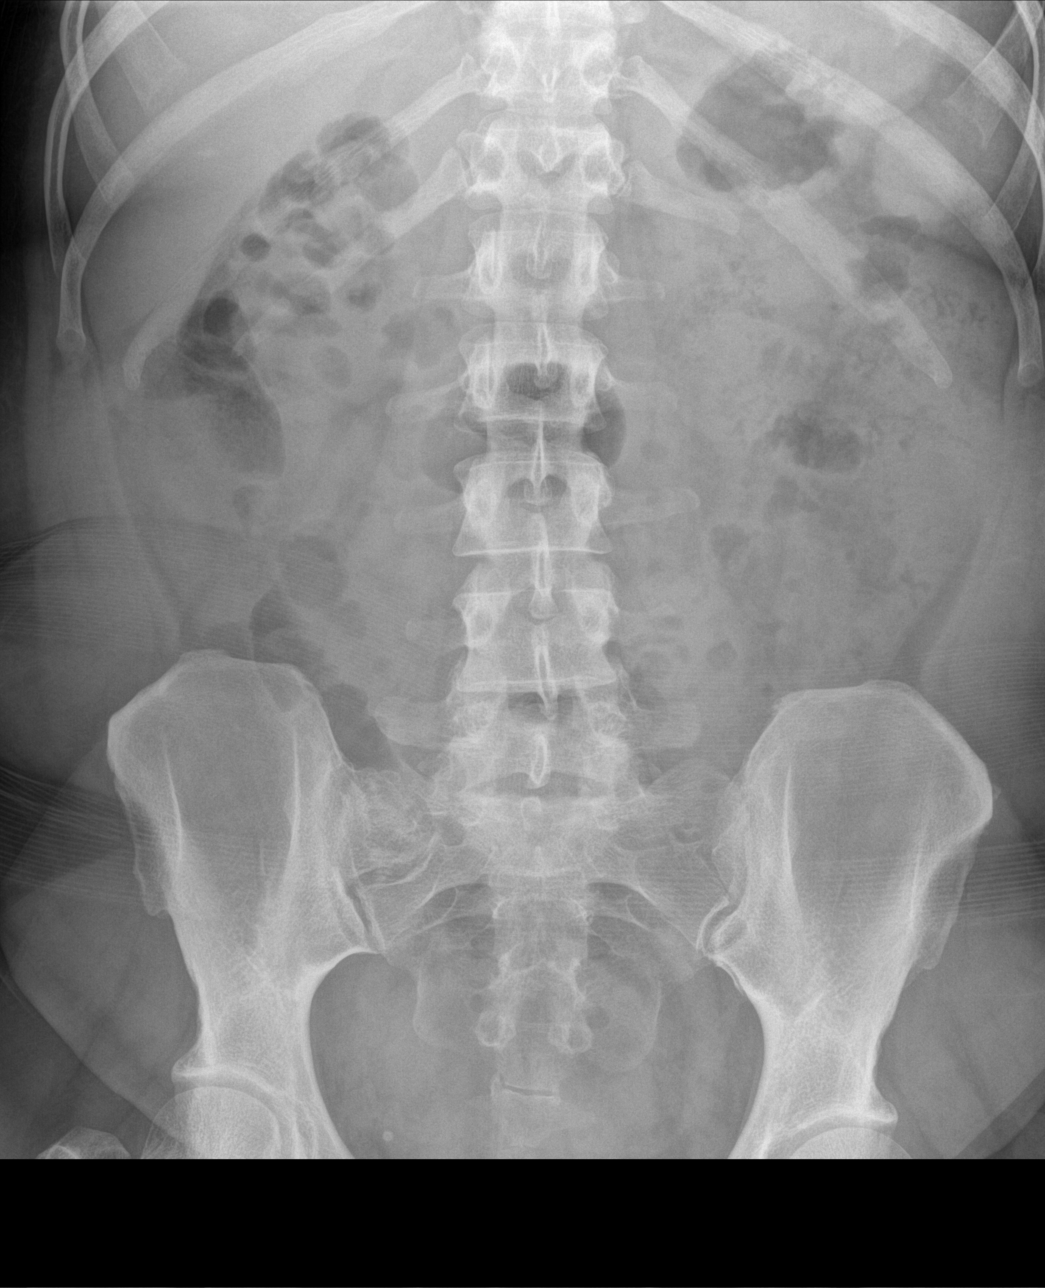

[3 of 3 positions shown; findings below may reference images not displayed]

FINDINGS: The cardiomediastinal contours are normal. The lungs are clear.
There is no free intra-abdominal air. No dilated bowel loops to
suggest obstruction. There is an air-fluid level in the stomach and
smaller fluid levels in normal caliber bowel loops in the right
abdomen. Small volume of stool throughout the colon. No radiopaque
calculi. Bilateral pelvic phleboliths are seen. No acute osseous
abnormalities are seen.
IMPRESSION: Nonobstructive bowel gas pattern. Scattered air-fluid levels, may
reflect enteritis.

## 2015-10-21 IMAGING — CT CT ABD-PELV W/ CM
2 of 5 series · 15 of 46 positions shown, 17 images · IV contrast (APPLIED)
Comparison: Abdominal radiograph performed 08/08/2014, right upper
quadrant ultrasound performed 04/03/2014, and MRI of the lumbar
spine performed 08/09/2013

CLINICAL DATA: Acute onset of nausea and vomiting. Chronic
diarrhea. Initial encounter.

EXAM:
CT ABDOMEN AND PELVIS WITH CONTRAST
TECHNIQUE: Multidetector CT imaging of the abdomen and pelvis was performed
using the standard protocol following bolus administration of
intravenous contrast.
CONTRAST:  100mL OMNIPAQUE IOHEXOL 300 MG/ML  SOLN

[Series 2: abd/ pelvis 5.0 i30f 1 · axial · 0.77mm/px · z∈[-438,+12]mm · 12 of 102 slices shown, 14 images]
[im 6/102  soft-tissue]
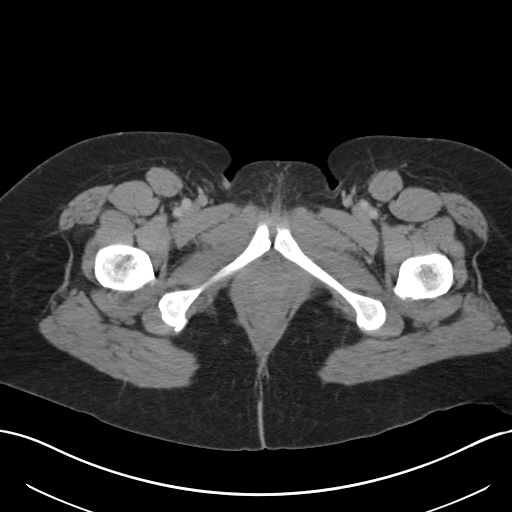
[im 6/102  bone]
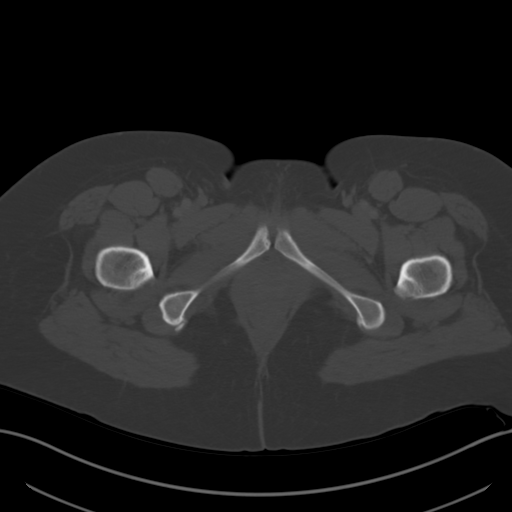
[im 16/102  soft-tissue]
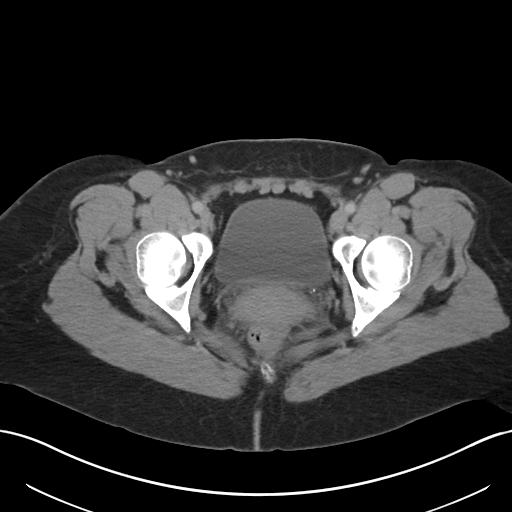
[im 22/102  soft-tissue]
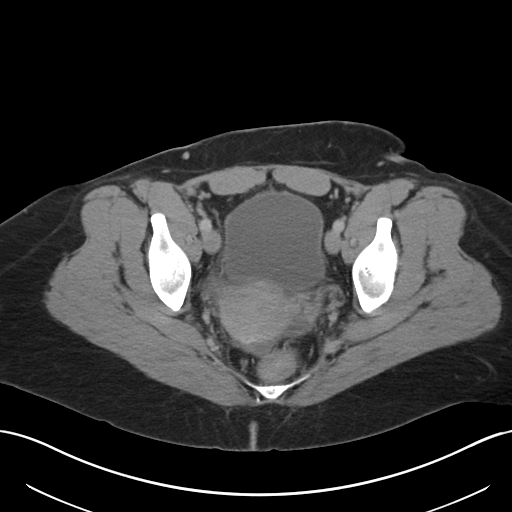
[im 32/102  soft-tissue]
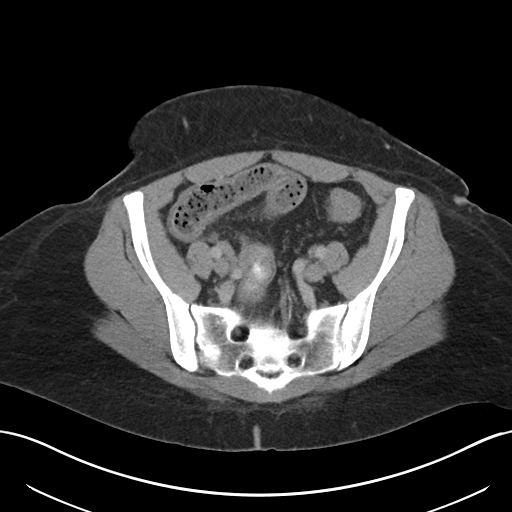
[im 38/102  soft-tissue]
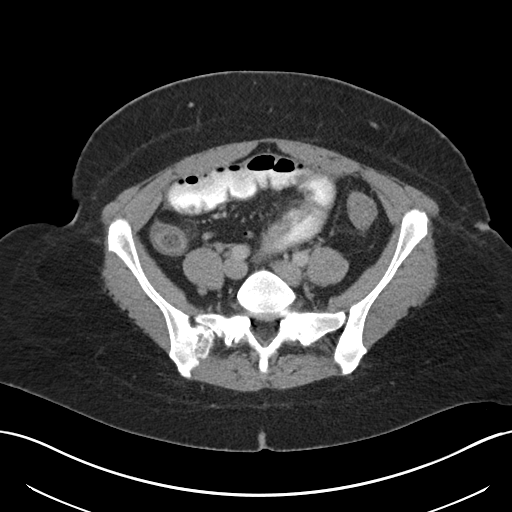
[im 48/102  soft-tissue]
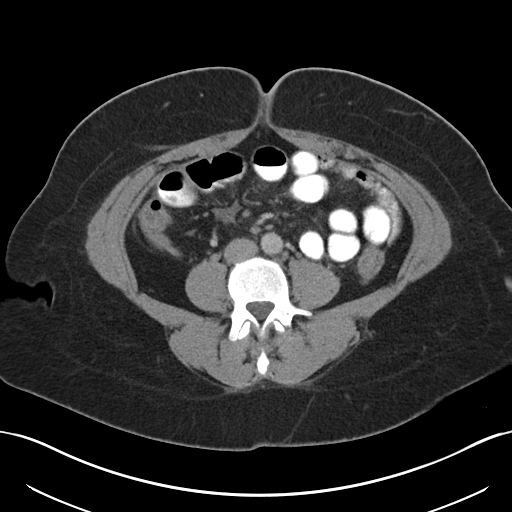
[im 54/102  soft-tissue]
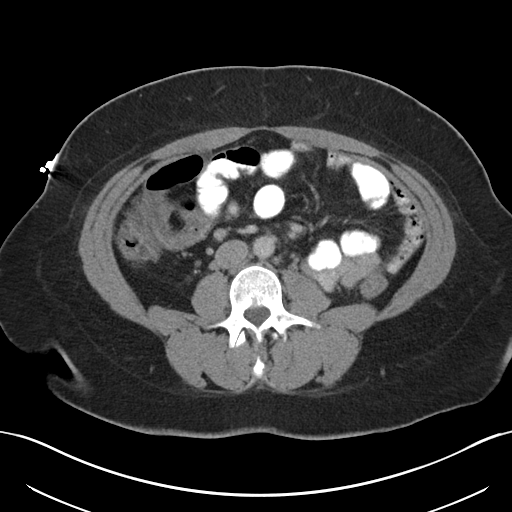
[im 64/102  soft-tissue]
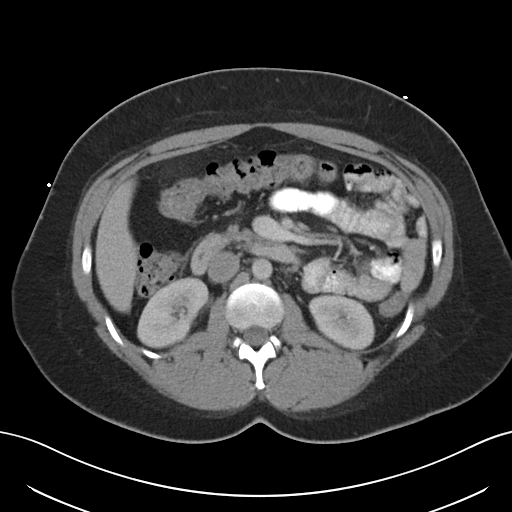
[im 70/102  soft-tissue]
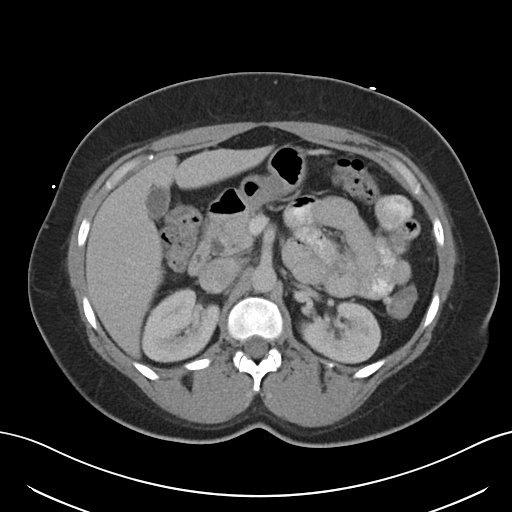
[im 70/102  bone]
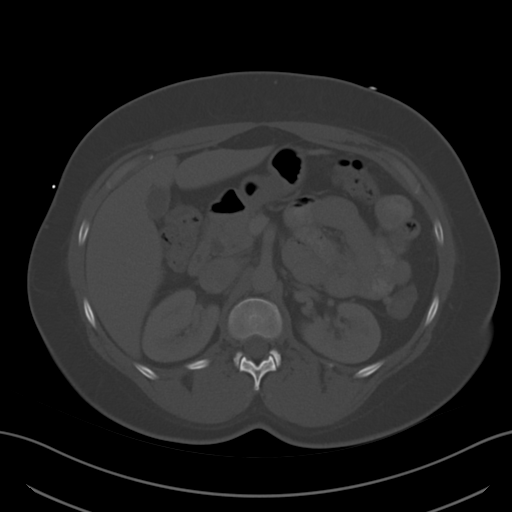
[im 80/102  soft-tissue]
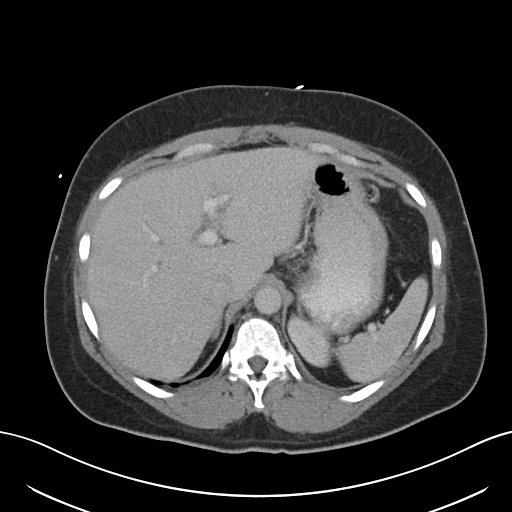
[im 86/102  soft-tissue]
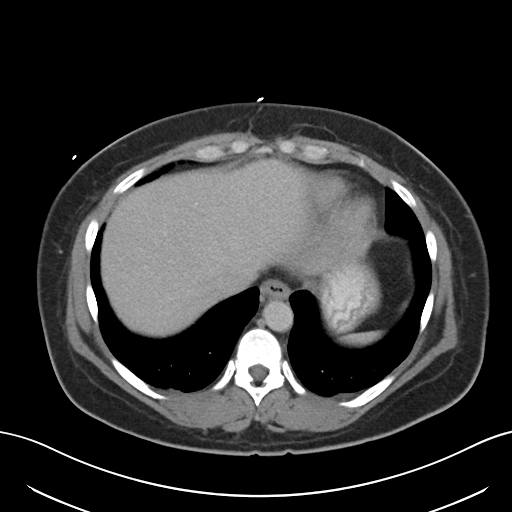
[im 96/102  soft-tissue]
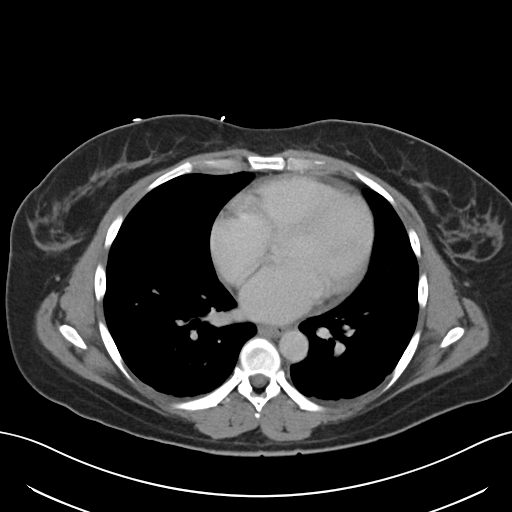

[Series 5: coronal soft tissue · coronal · 0.88mm/px · 3 of 101 slices shown]
[im 34/101  soft-tissue]
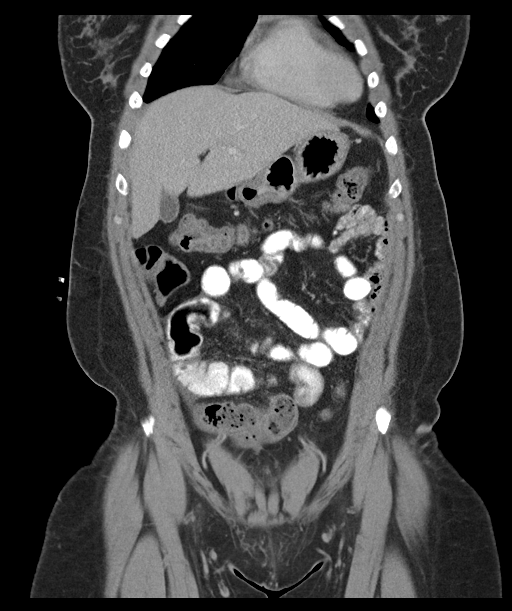
[im 45/101  soft-tissue]
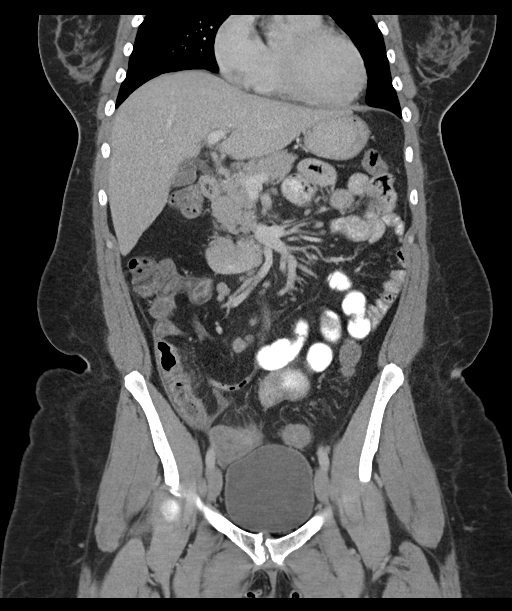
[im 56/101  soft-tissue]
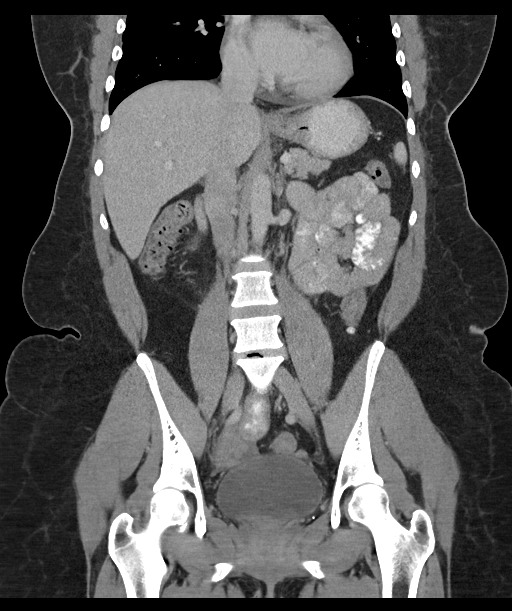

[15 of 46 positions shown; findings below may reference images not displayed]

FINDINGS: The visualized lung bases are clear.

The liver and spleen are unremarkable in appearance. The gallbladder
is within normal limits. The pancreas and adrenal glands are
unremarkable.

The kidneys are unremarkable in appearance. There is no evidence of
hydronephrosis. No renal or ureteral stones are seen. No perinephric
stranding is appreciated.

The distal ileum is coiled at the right lower quadrant. There is
diffuse wall thickening along the distal ileum, with less prominent
involvement of the terminal ileum, and associated trace free fluid
tracking into the pelvis. This may reflect an infectious or
inflammatory ileitis. Crohn's disease cannot be excluded.

The appendix remains normal in caliber and contains air, without
evidence for appendicitis.

The stomach is within normal limits. No acute vascular abnormalities
are seen.

A single diverticulum is noted along the distal ascending colon. A
few diverticula are noted at the distal descending and proximal
sigmoid colon. There is question of mild wall thickening along the
descending and sigmoid colon, though this may reflect relative
decompression.

The bladder is mildly distended and grossly unremarkable. The uterus
is unremarkable in appearance. The ovaries are grossly symmetric. No
suspicious adnexal masses are seen. No inguinal lymphadenopathy is
seen.

No acute osseous abnormalities are identified. There is
developmental irregularity of the right sacroiliac joint.
IMPRESSION: 1. Distal ileum coiled at the right lower quadrant. Diffuse wall
thickening along the distal ileum, with less prominent involvement
of the terminal ileum, and trace free fluid tracking into the
pelvis. This may reflect an infectious or inflammatory ileitis.
Crohn's disease cannot be excluded.
2. Question of mild wall thickening along the descending and sigmoid
colon, though this may reflect relative decompression. A mild
infectious or inflammatory process could have such an appearance.
3. Minimal diverticulosis along the distal descending and proximal
sigmoid colon, without evidence of diverticulitis.

## 2015-12-15 ENCOUNTER — Encounter (HOSPITAL_COMMUNITY): Payer: Self-pay | Admitting: Emergency Medicine

## 2015-12-15 ENCOUNTER — Emergency Department (HOSPITAL_COMMUNITY)
Admission: EM | Admit: 2015-12-15 | Discharge: 2015-12-15 | Disposition: A | Discharge status: Home / Self Care | Payer: No Typology Code available for payment source | Attending: Emergency Medicine | Admitting: Emergency Medicine

## 2015-12-15 DIAGNOSIS — J45909 Unspecified asthma, uncomplicated: Secondary | ICD-10-CM | POA: Insufficient documentation

## 2015-12-15 DIAGNOSIS — J029 Acute pharyngitis, unspecified: Secondary | ICD-10-CM | POA: Insufficient documentation

## 2015-12-15 DIAGNOSIS — H9203 Otalgia, bilateral: Secondary | ICD-10-CM | POA: Insufficient documentation

## 2015-12-15 DIAGNOSIS — Z8739 Personal history of other diseases of the musculoskeletal system and connective tissue: Secondary | ICD-10-CM | POA: Insufficient documentation

## 2015-12-15 LAB — RAPID STREP SCREEN (MED CTR MEBANE ONLY): Streptococcus, Group A Screen (Direct): NEGATIVE

## 2015-12-15 MED ORDER — PSEUDOEPHEDRINE HCL 30 MG PO TABS
30.0000 mg | ORAL_TABLET | Freq: Once | ORAL | Status: AC
  Administered 2015-12-15: 30 mg via ORAL
  Filled 2015-12-15: qty 1

## 2015-12-15 MED ORDER — PSEUDOEPHEDRINE HCL 30 MG PO TABS: 30.0000 mg | ORAL_TABLET | Freq: Two times a day (BID) | ORAL | Status: AC

## 2015-12-15 MED ORDER — PENICILLIN G BENZATHINE 1200000 UNIT/2ML IM SUSP
1.2000 10*6.[IU] | Freq: Once | INTRAMUSCULAR | Status: AC
  Administered 2015-12-15: 1.2 10*6.[IU] via INTRAMUSCULAR
  Filled 2015-12-15: qty 2

## 2015-12-15 MED ORDER — DEXAMETHASONE SODIUM PHOSPHATE 10 MG/ML IJ SOLN
10.0000 mg | Freq: Once | INTRAMUSCULAR | Status: AC
  Administered 2015-12-15: 10 mg via INTRAMUSCULAR
  Filled 2015-12-15: qty 1

## 2015-12-15 MED ORDER — FEXOFENADINE HCL 30 MG PO TABS: 30.0000 mg | ORAL_TABLET | Freq: Two times a day (BID) | ORAL | Status: AC

## 2015-12-15 MED ORDER — KETOROLAC TROMETHAMINE 30 MG/ML IJ SOLN
30.0000 mg | Freq: Once | INTRAMUSCULAR | Status: AC
  Administered 2015-12-15: 30 mg via INTRAMUSCULAR
  Filled 2015-12-15: qty 1

## 2015-12-15 NOTE — Discharge Instructions (Signed)

## 2015-12-15 NOTE — ED Notes (Signed)
C/o sore throat and bilateral ear pain x 2 days.

## 2015-12-15 NOTE — ED Provider Notes (Signed)
CSN: [id number]     Arrival date & time 12/15/15  0421 History   First MD Initiated Contact with Patient 12/15/15 [phone]     Chief Complaint  Patient presents with  . Sore Throat  . Otalgia     (Consider location/radiation/quality/duration/timing/severity/associated sxs/prior Treatment) HPI Patient presents with concern of sore throat, bilateral ear discomfort. Symptoms began 2 days ago. Since onset symptoms have been persistent, with no clear alleviating, exacerbating factors, but no medication taken. No fever, chills, nausea, vomiting, abdominal pain, chest pain. Patient was well prior to the onset of symptoms.  Past Medical History  Diagnosis Date  . Sciatica   . Herniated disc   . Asthma    Past Surgical History  Procedure Laterality Date  . Abdominal surgery    . Right oophorectomy     Family History  Problem Relation Age of Onset  . Cancer Mother     liver  . Anesthesia problems Father   . Heart disease Maternal Grandmother   . Seizures Paternal Grandfather    Social History  Substance Use Topics  . Smoking status: Never Smoker   . Smokeless tobacco: None  . Alcohol Use: No   OB History    No data available     Review of Systems  Constitutional:       Per HPI, otherwise negative  HENT:       Per HPI, otherwise negative  Respiratory:       Per HPI, otherwise negative  Cardiovascular:       Per HPI, otherwise negative  Gastrointestinal: Negative for vomiting.  Endocrine:       Negative aside from HPI  Genitourinary:       Neg aside from HPI   Musculoskeletal:       Per HPI, otherwise negative  Skin: Negative.   Neurological: Negative for syncope.      Allergies  Review of patient's allergies indicates no known allergies.  Home Medications   Prior to Admission medications   Medication Sig Start Date End Date Taking? Authorizing Provider  clomiPHENE (CLOMID) 50 MG tablet Take 1 tablet po daily starting day 5-9 of menstrual cycle. Patient  not taking: Reported on 12/15/2015 04/18/15   Brock Bad, MD  methocarbamol (ROBAXIN) 500 MG tablet Take 1 tablet (500 mg total) by mouth 2 (two) times daily. Patient not taking: Reported on 12/15/2015 09/24/15   Rolm Gala Barrett, PA-C  Prenat w/o A Vit-FeFum-FePo-FA (CONCEPT OB) 130-92.4-1 MG CAPS Take 1 capsule by mouth daily. Patient not taking: Reported on 12/15/2015 01/02/15   Brock Bad, MD   BP 139/103 mmHg  Pulse 67  Temp(Src) 97.9 F (36.6 C)  Resp 18  Ht  (1.651 m)  Wt 179 lb (81.194 kg)  BMI 29.79 kg/m2  SpO2 100% Physical Exam  Constitutional: She is oriented to person, place, and time. She appears well-developed and well-nourished. No distress.  HENT:  Head: Normocephalic and atraumatic.  Mouth/Throat: Uvula is midline. No uvula swelling. Oropharyngeal exudate and posterior oropharyngeal erythema present. No tonsillar abscesses.  Bilateral trace fluid in each tympanic membrane, otherwise unremarkable  Eyes: Conjunctivae and EOM are normal.  Cardiovascular: Normal rate and regular rhythm.   Pulmonary/Chest: Effort normal and breath sounds normal. No stridor. No respiratory distress.  Abdominal: She exhibits no distension.  Musculoskeletal: She exhibits no edema.  Lymphadenopathy:    She [id number] adenopathy.       Right cervical: Superficial cervical adenopathy present.  Left cervical: Superficial cervical adenopathy present.  Neurological: She is alert and oriented to person, place, and time. No cranial nerve deficit.  Skin: Skin is warm and dry.  Psychiatric: She has a normal mood and affect.  Nursing note and vitals reviewed.   ED Course  Procedures (including critical care time) Labs Review Labs Reviewed  RAPID STREP SCREEN ([facility])  CULTURE, GROUP A STREP [facility])      MDM  Generally well-appearing female presents with several days of sore throat, bilateral ear fullness. Here evidence for abscess, nor deep space infection, no evidence  for bacteremia or sepsis per There is clinical evidence for strep throat, and bilateral ear effusion. Patient was started on therapy here, discharged with ongoing antihistamine, Sudafed.   Gerhard Munchobert Lillan Mccreadie, MD 12/15/15 [phone]

## 2015-12-17 LAB — CULTURE, GROUP A STREP (THRC)

## 2016-07-07 ENCOUNTER — Emergency Department (HOSPITAL_COMMUNITY)
Admission: EM | Admit: 2016-07-07 | Discharge: 2016-07-07 | Disposition: A | Discharge status: Home / Self Care | Payer: Medicaid Other | Attending: Emergency Medicine | Admitting: Emergency Medicine

## 2016-07-07 ENCOUNTER — Encounter (HOSPITAL_COMMUNITY): Payer: Self-pay

## 2016-07-07 DIAGNOSIS — J45909 Unspecified asthma, uncomplicated: Secondary | ICD-10-CM | POA: Insufficient documentation

## 2016-07-07 DIAGNOSIS — A084 Viral intestinal infection, unspecified: Secondary | ICD-10-CM

## 2016-07-07 LAB — CBC
HCT: 39.9 % (ref 36.0–46.0)
Hemoglobin: 13.5 g/dL (ref 12.0–15.0)
MCH: 29 pg (ref 26.0–34.0)
MCHC: 33.8 g/dL (ref 30.0–36.0)
MCV: 85.6 fL (ref 78.0–100.0)
PLATELETS: 324 10*3/uL (ref 150–400)
RBC: 4.66 MIL/uL (ref 3.87–5.11)
RDW: 14.8 % (ref 11.5–15.5)
WBC: 12.1 10*3/uL — AB (ref 4.0–10.5)

## 2016-07-07 LAB — COMPREHENSIVE METABOLIC PANEL
ALBUMIN: 3.9 g/dL (ref 3.5–5.0)
ALT: 14 U/L (ref 14–54)
AST: 38 U/L (ref 15–41)
Alkaline Phosphatase: 57 U/L (ref 38–126)
Anion gap: 7 (ref 5–15)
BUN: 9 mg/dL (ref 6–20)
CHLORIDE: 107 mmol/L (ref 101–111)
CO2: 23 mmol/L (ref 22–32)
Calcium: 9.6 mg/dL (ref 8.9–10.3)
Creatinine, Ser: 1.08 mg/dL — ABNORMAL HIGH (ref 0.44–1.00)
GFR calc Af Amer: >60 mL/min (ref >60)
Glucose, Bld: 103 mg/dL — ABNORMAL HIGH (ref 65–99)
Potassium: 3.9 mmol/L (ref 3.5–5.1)
SODIUM: 137 mmol/L (ref 135–145)
Total Bilirubin: 0.7 mg/dL (ref 0.3–1.2)
Total Protein: 7.8 g/dL (ref 6.5–8.1)

## 2016-07-07 LAB — I-STAT BETA HCG BLOOD, ED (MC, WL, AP ONLY): I-stat hCG, quantitative: <5 m[IU]/mL (ref <5)

## 2016-07-07 LAB — LIPASE, BLOOD: Lipase: 40 U/L (ref 11–51)

## 2016-07-07 MED ORDER — SODIUM CHLORIDE 0.9 % IV BOLUS (SEPSIS)
1000.0000 mL | Freq: Once | INTRAVENOUS | Status: AC
  Administered 2016-07-07: 1000 mL via INTRAVENOUS

## 2016-07-07 MED ORDER — ONDANSETRON HCL 4 MG PO TABS: 4.0000 mg | ORAL_TABLET | Freq: Four times a day (QID) | ORAL | 0 refills | Status: AC

## 2016-07-07 MED ORDER — ONDANSETRON HCL 4 MG/2ML IJ SOLN
4.0000 mg | Freq: Once | INTRAMUSCULAR | Status: AC
  Administered 2016-07-07: 4 mg via INTRAVENOUS
  Filled 2016-07-07: qty 2

## 2016-07-07 NOTE — ED Provider Notes (Signed)
[facility]-EMERGENCY DEPT Provider Note   CSN: [id number] Arrival date & time: 07/07/16  [id number]  History   Chief Complaint Chief Complaint  Patient presents with  . Emesis  . Diarrhea  . Abdominal Pain    HPI Alisha Torres is a 45 y.o. female.  HPI  45 y.o. female presents to the Emergency Department today complaining of abdominal pain this morning around 0400. States eating [employer] last night around 9pm. Associated N/V/D. No sick contacts. No fevers. No CP/SOB. Notes x 6 episodes diarrhea and x 4 episodes emesis. No hematochezia. No hematemesis. No recent ABX usage. No other symptoms noted.   Past Medical History:  Diagnosis Date  . Asthma   . Herniated disc   . Sciatica     There are no active problems to display for this patient.   Past Surgical History:  Procedure Laterality Date  . ABDOMINAL SURGERY    . RIGHT OOPHORECTOMY      OB History    No data available       Home Medications    Prior to Admission medications   Medication Sig Start Date End Date Taking? Authorizing Provider  clomiPHENE (CLOMID) 50 MG tablet Take 1 tablet po daily starting day 5-9 of menstrual cycle. Patient not taking: Reported on 12/15/2015 04/18/15   Brock Badharles A Harper, MD  fexofenadine (ALLEGRA) 30 MG tablet Take 1 tablet (30 mg total) by mouth 2 (two) times daily. 12/15/15   Gerhard Munchobert Lockwood, MD  methocarbamol (ROBAXIN) 500 MG tablet Take 1 tablet (500 mg total) by mouth 2 (two) times daily. Patient not taking: Reported on 12/15/2015 09/24/15   Rolm GalaStevi Barrett, PA-C  Prenat w/o A Vit-FeFum-FePo-FA (CONCEPT OB) 130-92.4-1 MG CAPS Take 1 capsule by mouth daily. Patient not taking: Reported on 12/15/2015 01/02/15   Brock Badharles A Harper, MD    Family History Family History  Problem Relation Age of Onset  . Cancer Mother     liver  . Anesthesia problems Father   . Heart disease Maternal Grandmother   . Seizures Paternal Grandfather     Social History Social History  Substance Use Topics  .  Smoking status: Never Smoker  . Smokeless tobacco: Never Used  . Alcohol use No     Allergies   Patient has no known allergies.   Review of Systems Review of Systems ROS reviewed and all are negative for acute change except as noted in the HPI.  Physical Exam Updated Vital Signs BP 152/88 (BP Location: Left Arm)   Pulse 64   Temp 97.6 F (36.4 C) (Oral)   Resp 16   Ht 5\' 5"  (1.651 m)   Wt 89.8 kg   LMP 06/16/2016   SpO2 99%   BMI 32.95 kg/m   Physical Exam  Constitutional: She is oriented to person, place, and time. Vital signs are normal. She appears well-developed and well-nourished.  HENT:  Head: Normocephalic and atraumatic.  Right Ear: Hearing normal.  Left Ear: Hearing normal.  Eyes: Conjunctivae and EOM are normal. Pupils are equal, round, and reactive to light.  Neck: Normal range of motion. Neck supple.  Cardiovascular: Normal rate, regular rhythm, normal heart sounds and intact distal pulses.   Pulmonary/Chest: Effort normal and breath sounds normal.  Abdominal: Soft. Normal appearance and bowel sounds are normal. There is no tenderness. There is no rigidity, no rebound, no guarding, no CVA tenderness, no tenderness at McBurney's point and negative Murphy's sign.  Musculoskeletal: Normal range of motion.  Neurological: She is alert and  oriented to person, place, and time.  Skin: Skin is warm and dry.  Psychiatric: She has a normal mood and affect. Her speech is normal and behavior is normal. Thought content normal.  Nursing note and vitals reviewed.  ED Treatments / Results  Labs (all labs ordered are listed, but only abnormal results are displayed) Labs Reviewed - No data to display  EKG  EKG Interpretation None       Radiology No results found.  Procedures Procedures (including critical care time)  Medications Ordered in ED Medications - No data to display   Initial Impression / Assessment and Plan / ED Course  I have reviewed the triage  vital signs and the nursing notes.  Pertinent labs & imaging results that were available during my care of the patient were reviewed by me and considered in my medical decision making (see chart for details).  Clinical Course    Final Clinical Impressions(s) / ED Diagnoses  {I have reviewed and evaluated the relevant laboratory values.   {I have reviewed the relevant previous healthcare records.  {I obtained HPI from historian.   ED Course:  Assessment: Patient is a 45yF presents with abdominal pain this AM. Associated N/V/D. Notes ingestion [employer] last night. No fevers. No recent ABX usage. On exam, nontoxic, nonseptic appearing, in no apparent distress. Patient's pain and other symptoms adequately managed in emergency department.  Fluid bolus given.  Labs and vitals reviewed.  Patient does not meet the SIRS or Sepsis criteria.  On repeat exam patient does not have a surgical abdomen and there are no peritoneal signs.  No indication of appendicitis, bowel obstruction, bowel perforation, cholecystitis, diverticulitis, PID or ectopic pregnancy. Likely viral gastroenteritis. Improved with fluids and zofran. Patient discharged home with symptomatic treatment and given strict instructions for follow-up with their primary care physician.  I have also discussed reasons to return immediately to the ER.  Patient expresses understanding and agrees with plan.  Disposition/Plan:  DC Home Additional Verbal discharge instructions given and discussed with patient.  Pt Instructed to f/u with PCP in the next week for evaluation and treatment of symptoms. Return precautions given Pt acknowledges and agrees with plan  Supervising Physician Azalia BilisKevin Campos, MD  Final diagnoses:  Viral gastroenteritis    New Prescriptions New Prescriptions   ONDANSETRON (ZOFRAN) 4 MG TABLET    Take 1 tablet (4 mg total) by mouth every 6 (six) hours.     Audry Piliyler Nkechi Linehan, PA-C 07/07/16 [id number]    Azalia BilisKevin Campos, MD 07/07/16  [phone]

## 2016-07-07 NOTE — Discharge Instructions (Signed)
Please read and follow all provided instructions.  Your diagnoses today include:  1. Viral gastroenteritis     Tests performed today include: Vital signs. See below for your results today.   Medications prescribed:  Take as prescribed   Home care instructions:  Follow any educational materials contained in this packet.  Follow-up instructions: Please follow-up with your primary care provider for further evaluation of symptoms and treatment   Return instructions:  Please return to the Emergency Department if you do not get better, if you get worse, or new symptoms OR  - Fever (temperature greater than 101.95F)  - Bleeding that does not stop with holding pressure to the area    -Severe pain (please note that you may be more sore the day after your accident)  - Chest Pain  - Difficulty breathing  - Severe nausea or vomiting  - Inability to tolerate food and liquids  - Passing out  - Skin becoming red around your wounds  - Change in mental status (confusion or lethargy)  - New numbness or weakness    Please return if you have any other emergent concerns.  Additional Information:  Your vital signs today were: BP 152/88 (BP Location: Left Arm)    Pulse 64    Temp 97.6 F (36.4 C) (Oral)    Resp 16    Ht 5\' 5"  (1.651 m)    Wt 89.8 kg    LMP 06/16/2016    SpO2 99%    BMI 32.95 kg/m  If your blood pressure (BP) was elevated above 135/85 this visit, please have this repeated by your doctor within one month. ---------------

## 2016-07-07 NOTE — ED Triage Notes (Signed)
Pt c/o nausea vomiting and diarrhea this am. Abdominal pain. C/o chills.

## 2016-07-07 NOTE — ED Notes (Signed)
Pt states she understands instructions. Home stable with steady gait. 

## 2016-08-09 ENCOUNTER — Emergency Department (HOSPITAL_COMMUNITY)
Admission: EM | Admit: 2016-08-09 | Discharge: 2016-08-09 | Disposition: A | Discharge status: Home / Self Care | Payer: Self-pay | Attending: Emergency Medicine | Admitting: Emergency Medicine

## 2016-08-09 ENCOUNTER — Emergency Department (HOSPITAL_COMMUNITY): Payer: Self-pay

## 2016-08-09 ENCOUNTER — Encounter (HOSPITAL_COMMUNITY): Payer: Self-pay | Admitting: *Deleted

## 2016-08-09 DIAGNOSIS — R197 Diarrhea, unspecified: Secondary | ICD-10-CM

## 2016-08-09 DIAGNOSIS — J111 Influenza due to unidentified influenza virus with other respiratory manifestations: Secondary | ICD-10-CM

## 2016-08-09 DIAGNOSIS — R05 Cough: Secondary | ICD-10-CM

## 2016-08-09 DIAGNOSIS — R52 Pain, unspecified: Secondary | ICD-10-CM

## 2016-08-09 DIAGNOSIS — R059 Cough, unspecified: Secondary | ICD-10-CM

## 2016-08-09 DIAGNOSIS — R69 Illness, unspecified: Secondary | ICD-10-CM

## 2016-08-09 DIAGNOSIS — J45909 Unspecified asthma, uncomplicated: Secondary | ICD-10-CM | POA: Insufficient documentation

## 2016-08-09 DIAGNOSIS — J069 Acute upper respiratory infection, unspecified: Secondary | ICD-10-CM

## 2016-08-09 HISTORY — DX: Unspecified ectopic pregnancy without intrauterine pregnancy: O00.90

## 2016-08-09 LAB — COMPREHENSIVE METABOLIC PANEL
ALBUMIN: 3.8 g/dL (ref 3.5–5.0)
ALK PHOS: 63 U/L (ref 38–126)
ALT: 13 U/L — AB (ref 14–54)
AST: 41 U/L (ref 15–41)
Anion gap: 10 (ref 5–15)
BUN: 6 mg/dL (ref 6–20)
CALCIUM: 9.5 mg/dL (ref 8.9–10.3)
CO2: 20 mmol/L — AB (ref 22–32)
CREATININE: 0.96 mg/dL (ref 0.44–1.00)
Chloride: 104 mmol/L (ref 101–111)
GFR calc Af Amer: >60 mL/min (ref >60)
GFR calc non Af Amer: >60 mL/min (ref >60)
GLUCOSE: 110 mg/dL — AB (ref 65–99)
Potassium: 3.9 mmol/L (ref 3.5–5.1)
SODIUM: 134 mmol/L — AB (ref 135–145)
Total Bilirubin: 0.3 mg/dL (ref 0.3–1.2)
Total Protein: 7.8 g/dL (ref 6.5–8.1)

## 2016-08-09 LAB — CBC
HCT: 38.3 % (ref 36.0–46.0)
Hemoglobin: 12.9 g/dL (ref 12.0–15.0)
MCH: 28.9 pg (ref 26.0–34.0)
MCHC: 33.7 g/dL (ref 30.0–36.0)
MCV: 85.9 fL (ref 78.0–100.0)
PLATELETS: 325 10*3/uL (ref 150–400)
RBC: 4.46 MIL/uL (ref 3.87–5.11)
RDW: 14.5 % (ref 11.5–15.5)
WBC: 5.2 10*3/uL (ref 4.0–10.5)

## 2016-08-09 MED ORDER — ALBUTEROL SULFATE HFA 108 (90 BASE) MCG/ACT IN AERS: 2.0000 | INHALATION_SPRAY | RESPIRATORY_TRACT | 0 refills | Status: AC | PRN

## 2016-08-09 MED ORDER — ALBUTEROL SULFATE HFA 108 (90 BASE) MCG/ACT IN AERS
2.0000 | INHALATION_SPRAY | Freq: Once | RESPIRATORY_TRACT | Status: AC
  Administered 2016-08-09: 2 via RESPIRATORY_TRACT
  Filled 2016-08-09: qty 6.7

## 2016-08-09 MED ORDER — SODIUM CHLORIDE 0.9 % IV BOLUS (SEPSIS)
2000.0000 mL | Freq: Once | INTRAVENOUS | Status: AC
  Administered 2016-08-09: 2000 mL via INTRAVENOUS

## 2016-08-09 MED ORDER — ACETAMINOPHEN 325 MG PO TABS
ORAL_TABLET | ORAL | Status: AC
  Filled 2016-08-09: qty 2

## 2016-08-09 MED ORDER — OSELTAMIVIR PHOSPHATE 75 MG PO CAPS: 75.0000 mg | ORAL_CAPSULE | Freq: Two times a day (BID) | ORAL | 0 refills | Status: AC

## 2016-08-09 MED ORDER — ACETAMINOPHEN 325 MG PO TABS
650.0000 mg | ORAL_TABLET | Freq: Once | ORAL | Status: AC
  Administered 2016-08-09: 650 mg via ORAL

## 2016-08-09 MED ORDER — IPRATROPIUM BROMIDE 0.02 % IN SOLN
0.5000 mg | Freq: Once | RESPIRATORY_TRACT | Status: AC
  Administered 2016-08-09: 0.5 mg via RESPIRATORY_TRACT
  Filled 2016-08-09: qty 2.5

## 2016-08-09 MED ORDER — KETOROLAC TROMETHAMINE 30 MG/ML IJ SOLN
30.0000 mg | Freq: Once | INTRAMUSCULAR | Status: AC
  Administered 2016-08-09: 30 mg via INTRAVENOUS
  Filled 2016-08-09: qty 1

## 2016-08-09 MED ORDER — ALBUTEROL SULFATE (2.5 MG/3ML) 0.083% IN NEBU
5.0000 mg | INHALATION_SOLUTION | Freq: Once | RESPIRATORY_TRACT | Status: AC
  Administered 2016-08-09: 5 mg via RESPIRATORY_TRACT
  Filled 2016-08-09: qty 6

## 2016-08-09 NOTE — ED Provider Notes (Signed)
[facility]-EMERGENCY DEPT Provider Note   CSN: [id number] Arrival date & time: 08/09/16  7829     History   Chief Complaint Chief Complaint  Patient presents with  . URI  . Diarrhea    HPI Alisha Torres is a 46 y.o. female with a PMHx of asthma, sciatica, herniated disc, and R oophorectomy for ectopic pregnancy, who presents to the ED with complaints of URI symptoms 2 days but gradually worsened yesterday. Symptoms include dry cough, generalized body aches, headache, chills, bilateral ear pain, and 5-6 episodes of watery nonbloody diarrhea. She states that her symptoms worsen "with sitting still", and she has tried Aleve, Mucinex cold and flu, and Alka-Seltzer with no relief. Her grandson is also sick although he was sick several days before she developed symptoms. Nonsmoker.   She denies fevers, ear drainage, sore throat, rhinorrhea, wheezing, CP, SOB, abd pain, N/V/C, obstipation, melena, hematochezia, hematuria, dysuria, numbness, tingling, focal weakness, vision changes, lightheadedness, or any other complaints at this time. Denies suspicious food intake, recent foreign travel, frequent EtOH or NSAID use, or recent abx.    The history is provided by the patient and medical records. No language interpreter was used.  URI   This is a new problem. The current episode started 2 days ago. The problem has been gradually worsening. There has been no fever. Associated symptoms include diarrhea, ear pain, headaches and cough. Pertinent negatives include no chest pain, no abdominal pain, no nausea, no vomiting, no dysuria, no rhinorrhea, no sore throat and no wheezing. She has tried other medications for the symptoms. The treatment provided no relief.  Diarrhea   Associated symptoms include chills, headaches, myalgias (generalized body aches), URI and cough. Pertinent negatives include no abdominal pain, no vomiting and no arthralgias.    Past Medical History:  Diagnosis Date  . Asthma   .  Ectopic pregnancy   . Herniated disc   . Sciatica     There are no active problems to display for this patient.   Past Surgical History:  Procedure Laterality Date  . ABDOMINAL SURGERY    . RIGHT OOPHORECTOMY      OB History    No data available       Home Medications    Prior to Admission medications   Medication Sig Start Date End Date Taking? Authorizing Provider  clomiPHENE (CLOMID) 50 MG tablet Take 1 tablet po daily starting day 5-9 of menstrual cycle. Patient not taking: Reported on 07/07/2016 04/18/15   Brock Bad, MD  fexofenadine (ALLEGRA) 30 MG tablet Take 1 tablet (30 mg total) by mouth 2 (two) times daily. Patient not taking: Reported on 07/07/2016 12/15/15   Gerhard Munch, MD  methocarbamol (ROBAXIN) 500 MG tablet Take 1 tablet (500 mg total) by mouth 2 (two) times daily. Patient not taking: Reported on 07/07/2016 09/24/15   Rolm Gala Barrett, PA-C  ondansetron (ZOFRAN) 4 MG tablet Take 1 tablet (4 mg total) by mouth every 6 (six) hours. 07/07/16   Audry Pili, PA-C  Prenat w/o A Vit-FeFum-FePo-FA (CONCEPT OB) 130-92.4-1 MG CAPS Take 1 capsule by mouth daily. Patient not taking: Reported on 07/07/2016 01/02/15   Brock Bad, MD    Family History Family History  Problem Relation Age of Onset  . Cancer Mother     liver  . Anesthesia problems Father   . Heart disease Maternal Grandmother   . Seizures Paternal Grandfather     Social History Social History  Substance Use Topics  . Smoking status:  Never Smoker  . Smokeless tobacco: Never Used  . Alcohol use No     Allergies   Patient has no known allergies.   Review of Systems Review of Systems  Constitutional: Positive for chills. Negative for fever.  HENT: Positive for ear pain. Negative for ear discharge, rhinorrhea and sore throat.   Eyes: Negative for visual disturbance.  Respiratory: Positive for cough. Negative for shortness of breath and wheezing.   Cardiovascular: Negative for chest  pain.  Gastrointestinal: Positive for diarrhea. Negative for abdominal pain, blood in stool, constipation, nausea and vomiting.  Genitourinary: Negative for dysuria and hematuria.  Musculoskeletal: Positive for myalgias (generalized body aches). Negative for arthralgias.  Skin: Negative for color change.  Allergic/Immunologic: Negative for immunocompromised state.  Neurological: Positive for headaches. Negative for weakness, light-headedness and numbness.  Psychiatric/Behavioral: Negative for confusion.   10 Systems reviewed and are negative for acute change except as noted in the HPI.   Physical Exam Updated Vital Signs BP 168/98 (BP Location: Right Arm)   Pulse 94   Temp 99.8 F (37.7 C) (Oral)   Resp 24   Ht 5\' 5"  (1.651 m)   Wt 93 kg   SpO2 100%   BMI 34.11 kg/m   Physical Exam  Constitutional: She is oriented to person, place, and time. Vital signs are normal. She appears well-developed and well-nourished.  Non-toxic appearance. No distress.  Low-grade temp 99.8 but feels warm, nontoxic, NAD although appears to not feel well but not septic appearing  HENT:  Head: Normocephalic and atraumatic.  Right Ear: Hearing, tympanic membrane, external ear and ear canal normal.  Left Ear: Hearing, tympanic membrane, external ear and ear canal normal.  Nose: Mucosal edema present. No rhinorrhea.  Mouth/Throat: Uvula is midline, oropharynx is clear and moist and mucous membranes are normal. No trismus in the jaw. No uvula swelling. Tonsils are 0 on the right. Tonsils are 0 on the left. No tonsillar exudate.  Ears are clear bilaterally. Nose with mild mucosal edema, no rhinorrhea. Oropharynx clear and moist, without uvular swelling or deviation, no trismus or drooling, no tonsillar swelling or erythema, no exudates.    Eyes: Conjunctivae and EOM are normal. Right eye exhibits no discharge. Left eye exhibits no discharge.  Neck: Normal range of motion. Neck supple.  Cardiovascular: Regular  rhythm, normal heart sounds and intact distal pulses.  Exam reveals no gallop and no friction rub.   No murmur heard. Pulmonary/Chest: Effort normal and breath sounds normal. No respiratory distress. She has no decreased breath sounds. She has no wheezes. She has no rhonchi. She has no rales.  Spasmodic cough noted throughout entire exam/evaluation, but lungs CTAB in all lung fields, no w/r/r, no hypoxia or increased WOB, speaking in full sentences, SpO2 100% on RA   Abdominal: Soft. Normal appearance and bowel sounds are normal. She exhibits no distension. There is no tenderness. There is no rigidity, no rebound, no guarding, no CVA tenderness, no tenderness at McBurney's point and negative Murphy's sign.  Soft, NTND, +BS throughout, no r/g/r, neg murphy's, neg mcburney's, no CVA TTP   Musculoskeletal: Normal range of motion.  Neurological: She is alert and oriented to person, place, and time. She has normal strength. No sensory deficit.  Skin: Skin is warm, dry and intact. No rash noted.  Psychiatric: She has a normal mood and affect.  Nursing note and vitals reviewed.    ED Treatments / Results  Labs (all labs ordered are listed, but only abnormal results are  displayed) Labs Reviewed  COMPREHENSIVE METABOLIC PANEL - Abnormal; Notable for the following:       Result Value   Sodium 134 (*)    CO2 20 (*)    Glucose, Bld 110 (*)    ALT 13 (*)    All other components within normal limits  CBC    EKG  EKG Interpretation None       Radiology Dg Chest 2 View  Result Date: 08/09/2016 CLINICAL DATA:  Cough and upper respiratory tract infection. EXAM: CHEST  2 VIEW COMPARISON:  08/08/2014 FINDINGS: Normal heart size. Lungs clear. No pneumothorax. No pleural effusion. IMPRESSION: No active cardiopulmonary disease. Electronically Signed   By: Jolaine Click M.D.   On: 08/09/2016 12:24    Procedures Procedures (including critical care time)  Medications Ordered in ED Medications    acetaminophen (TYLENOL) 325 MG tablet (not administered)  albuterol (PROVENTIL HFA;VENTOLIN HFA) 108 (90 Base) MCG/ACT inhaler 2 puff (not administered)  acetaminophen (TYLENOL) tablet 650 mg (650 mg Oral Given 08/09/16 1056)  albuterol (PROVENTIL) (2.5 MG/3ML) 0.083% nebulizer solution 5 mg (5 mg Nebulization Given 08/09/16 1223)  ipratropium (ATROVENT) nebulizer solution 0.5 mg (0.5 mg Nebulization Given 08/09/16 1223)  sodium chloride 0.9 % bolus 2,000 mL (2,000 mLs Intravenous New Bag/Given 08/09/16 1230)  ketorolac (TORADOL) 30 MG/ML injection 30 mg (30 mg Intravenous Given 08/09/16 1230)     Initial Impression / Assessment and Plan / ED Course  I have reviewed the triage vital signs and the nursing notes.  Pertinent labs & imaging results that were available during my care of the patient were reviewed by me and considered in my medical decision making (see chart for details).  Clinical Course     46 y.o. female here with flu-like symptoms x2 days, dry cough/diarrhea/otalgia/body aches/chills, +sick contacts at home; low-grade temp in triage (99.8) and pt feels warm. Throat clear, ears clear, abd soft and nontender, lungs overall clear but pt coughing vigorously throughout exam. Given that she's within the window of having some benefit from tamiflu, will empirically tx; will opt to not test at this time. Labs done in triage unremarkable; will get CXR, give toradol, 2L bolus fluids, and duoneb. Will reassess shortly  2:10 PM CXR negative. Pt feeling better and feels like her cough is more productive after meds; lung sounds/spasmodic coughing slightly improved after duoneb. Fluids still not finished, not quite half-way done; will allow those to finish. Will give inhaler here and once fluids are done then plan is to d/c home with tamiflu and inhaler rx; advised BRAT diet, good hydration, OTC meds for symptom control, and f/up with PCP in 1wk for recheck. Will await for fluids to finish, nurse  will advise when they're done.  3:51 PM Fluids nearly finished, pt comfortable and improved at this time; will d/c home with previously outlined plan. I explained the diagnosis and have given explicit precautions to return to the ER including for any other new or worsening symptoms. The patient understands and accepts the medical plan as it's been dictated and I have answered their questions. Discharge instructions concerning home care and prescriptions have been given. The patient is STABLE and is discharged to home in good condition.   Final Clinical Impressions(s) / ED Diagnoses   Final diagnoses:  Influenza-like illness  Cough  Upper respiratory tract infection, unspecified type  Diarrhea, unspecified type  Body aches    New Prescriptions New Prescriptions   ALBUTEROL (PROVENTIL HFA;VENTOLIN HFA) 108 (90 BASE) MCG/ACT INHALER  Inhale 2 puffs into the lungs every 4 (four) hours as needed for wheezing or shortness of breath (cough).   OSELTAMIVIR (TAMIFLU) 75 MG CAPSULE    Take 1 capsule (75 mg total) by mouth every 12 (twelve) hours.     [address]Blia Totman Strupp Marsela Kuan, PA-C 08/09/16 1552    Gwyneth SproutWhitney Plunkett, MD 08/09/16 Ernestina [id number]

## 2016-08-09 NOTE — ED Notes (Signed)
ED Provider at bedside. 

## 2016-08-09 NOTE — ED Triage Notes (Signed)
Pt states upper respiratory s/s, earache, headache and diarrhea since yesterday.

## 2016-08-09 NOTE — Discharge Instructions (Signed)
Continue to stay well-hydrated. Continue to alternate between Tylenol and Ibuprofen for pain or fever. Use Mucinex for cough suppression/expectoration of mucus. Use netipot and flonase to help with nasal congestion. May consider over-the-counter Benadryl or other antihistamine to decrease secretions and for help with your symptoms. Use inhaler as directed, as needed for cough/chest congestion/wheezing/shortness of breath. Take tamiflu as directed until completed; this will help decrease the severity and duration of symptoms if your illness is due to the flu; however, it's possible that your illness is due to some other virus, and may not change with tamiflu use, but rather would just take time to improve on its own.  For your diarrhea: Follow a BRAT (banana-rice-applesauce-toast) diet as described below for the next 24-48 hours. The 'BRAT' diet is suggested, then progress to diet as tolerated as symptoms abate. Call your regular doctor if bloody stools, persistent diarrhea, vomiting, fever or abdominal pain.   Follow up with your primary care doctor in 5-7 days for recheck of ongoing symptoms. Return to emergency department for emergent changing or worsening of symptoms.

## 2016-08-09 NOTE — ED Notes (Addendum)
Pt has taken aleve, mucinex, alka seltzer plus cold and flu -- no relief with anything per pt. Pt has dry hacky cough, causing ears to hurt.  Denies sore throat, has had diarrhea x 6 today. No dark tarry stools, no blood in stool.  Grandson in peds at present-- has same symptoms-- pt's symptoms started on Saturday.

## 2016-08-09 NOTE — ED Notes (Signed)
Patient transported to X-ray
